# Patient Record
Sex: Male | Born: 1950 | Race: Black or African American | Hispanic: No | Marital: Married | Smoking: Former smoker
Health system: Southern US, Community
[De-identification: ages and names within clinical notes are randomized; demographics above are authoritative.]

## PROBLEM LIST (undated history)

## (undated) DIAGNOSIS — H409 Unspecified glaucoma: Secondary | ICD-10-CM

## (undated) DIAGNOSIS — A4902 Methicillin resistant Staphylococcus aureus infection, unspecified site: Secondary | ICD-10-CM

## (undated) DIAGNOSIS — E119 Type 2 diabetes mellitus without complications: Secondary | ICD-10-CM

## (undated) DIAGNOSIS — K769 Liver disease, unspecified: Secondary | ICD-10-CM

## (undated) DIAGNOSIS — E785 Hyperlipidemia, unspecified: Secondary | ICD-10-CM

## (undated) DIAGNOSIS — I10 Essential (primary) hypertension: Secondary | ICD-10-CM

## (undated) DIAGNOSIS — K219 Gastro-esophageal reflux disease without esophagitis: Secondary | ICD-10-CM

## (undated) DIAGNOSIS — Z794 Long term (current) use of insulin: Secondary | ICD-10-CM

## (undated) DIAGNOSIS — H25019 Cortical age-related cataract, unspecified eye: Secondary | ICD-10-CM

## (undated) DIAGNOSIS — N189 Chronic kidney disease, unspecified: Secondary | ICD-10-CM

## (undated) DIAGNOSIS — K759 Inflammatory liver disease, unspecified: Secondary | ICD-10-CM

## (undated) DIAGNOSIS — E7800 Pure hypercholesterolemia, unspecified: Secondary | ICD-10-CM

## (undated) HISTORY — PX: HAND EXPLORATION: SHX1725

## (undated) HISTORY — PX: WISDOM TOOTH EXTRACTION: SHX21

## (undated) HISTORY — PX: COLONOSCOPY: SHX5424

---

## 2012-05-06 ENCOUNTER — Ambulatory Visit: Payer: Self-pay | Admitting: Family Medicine

## 2012-05-24 ENCOUNTER — Ambulatory Visit: Payer: Self-pay | Admitting: Family Medicine

## 2012-06-23 ENCOUNTER — Ambulatory Visit: Payer: Self-pay | Admitting: Family Medicine

## 2012-07-24 ENCOUNTER — Ambulatory Visit: Payer: Self-pay | Admitting: Family Medicine

## 2017-05-09 ENCOUNTER — Other Ambulatory Visit: Payer: Self-pay | Admitting: Internal Medicine

## 2017-05-09 DIAGNOSIS — R1314 Dysphagia, pharyngoesophageal phase: Secondary | ICD-10-CM

## 2017-05-09 DIAGNOSIS — R1011 Right upper quadrant pain: Secondary | ICD-10-CM

## 2017-05-15 ENCOUNTER — Encounter
Admission: RE | Admit: 2017-05-15 | Discharge: 2017-05-15 | Disposition: A | Discharge status: Home / Self Care | Payer: Medicare Other | Source: Ambulatory Visit | Attending: Internal Medicine | Admitting: Internal Medicine

## 2017-05-15 ENCOUNTER — Ambulatory Visit
Admission: RE | Admit: 2017-05-15 | Discharge: 2017-05-15 | Disposition: A | Discharge status: Home / Self Care | Payer: Medicare Other | Source: Ambulatory Visit | Attending: Internal Medicine | Admitting: Internal Medicine

## 2017-05-15 DIAGNOSIS — R1011 Right upper quadrant pain: Secondary | ICD-10-CM | POA: Diagnosis present

## 2017-05-15 DIAGNOSIS — R112 Nausea with vomiting, unspecified: Secondary | ICD-10-CM | POA: Insufficient documentation

## 2017-05-15 DIAGNOSIS — K8020 Calculus of gallbladder without cholecystitis without obstruction: Secondary | ICD-10-CM | POA: Insufficient documentation

## 2017-05-15 MED ORDER — TECHNETIUM TC 99M MEBROFENIN IV KIT
5.0000 | PACK | Freq: Once | INTRAVENOUS | Status: AC | PRN
  Administered 2017-05-15: 5.23 via INTRAVENOUS

## 2017-05-17 ENCOUNTER — Ambulatory Visit
Admission: RE | Admit: 2017-05-17 | Discharge: 2017-05-17 | Disposition: A | Discharge status: Home / Self Care | Payer: Medicare Other | Source: Ambulatory Visit | Attending: Internal Medicine | Admitting: Internal Medicine

## 2017-05-17 DIAGNOSIS — R1314 Dysphagia, pharyngoesophageal phase: Secondary | ICD-10-CM | POA: Insufficient documentation

## 2017-06-26 ENCOUNTER — Other Ambulatory Visit: Payer: Self-pay | Admitting: Student

## 2017-06-26 DIAGNOSIS — R112 Nausea with vomiting, unspecified: Secondary | ICD-10-CM

## 2017-07-18 ENCOUNTER — Other Ambulatory Visit: Payer: Medicare Other

## 2017-07-20 ENCOUNTER — Encounter
Admission: RE | Admit: 2017-07-20 | Discharge: 2017-07-20 | Disposition: A | Discharge status: Home / Self Care | Payer: Medicare Other | Source: Ambulatory Visit | Attending: Student | Admitting: Student

## 2017-07-20 DIAGNOSIS — R112 Nausea with vomiting, unspecified: Secondary | ICD-10-CM | POA: Insufficient documentation

## 2017-07-20 MED ORDER — TECHNETIUM TC 99M SULFUR COLLOID
2.0000 | Freq: Once | INTRAVENOUS | Status: AC | PRN
  Administered 2017-07-20: 2.32 via INTRAVENOUS

## 2017-09-17 ENCOUNTER — Encounter: Payer: Self-pay | Admitting: *Deleted

## 2017-09-18 ENCOUNTER — Ambulatory Visit
Admission: RE | Admit: 2017-09-18 | Discharge: 2017-09-18 | Disposition: A | Discharge status: Home / Self Care | Payer: Medicare Other | Source: Ambulatory Visit | Attending: Internal Medicine | Admitting: Internal Medicine

## 2017-09-18 ENCOUNTER — Ambulatory Visit: Payer: Medicare Other | Admitting: Anesthesiology

## 2017-09-18 ENCOUNTER — Other Ambulatory Visit: Payer: Self-pay

## 2017-09-18 ENCOUNTER — Encounter: Payer: Self-pay | Admitting: *Deleted

## 2017-09-18 ENCOUNTER — Encounter
Admission: RE | Disposition: A | Discharge status: Home / Self Care | Payer: Self-pay | Source: Ambulatory Visit | Attending: Internal Medicine

## 2017-09-18 DIAGNOSIS — H42 Glaucoma in diseases classified elsewhere: Secondary | ICD-10-CM | POA: Insufficient documentation

## 2017-09-18 DIAGNOSIS — K2950 Unspecified chronic gastritis without bleeding: Secondary | ICD-10-CM | POA: Diagnosis not present

## 2017-09-18 DIAGNOSIS — Z87891 Personal history of nicotine dependence: Secondary | ICD-10-CM | POA: Insufficient documentation

## 2017-09-18 DIAGNOSIS — Z794 Long term (current) use of insulin: Secondary | ICD-10-CM | POA: Diagnosis not present

## 2017-09-18 DIAGNOSIS — E1122 Type 2 diabetes mellitus with diabetic chronic kidney disease: Secondary | ICD-10-CM | POA: Diagnosis not present

## 2017-09-18 DIAGNOSIS — B1920 Unspecified viral hepatitis C without hepatic coma: Secondary | ICD-10-CM | POA: Insufficient documentation

## 2017-09-18 DIAGNOSIS — K219 Gastro-esophageal reflux disease without esophagitis: Secondary | ICD-10-CM | POA: Insufficient documentation

## 2017-09-18 DIAGNOSIS — E7800 Pure hypercholesterolemia, unspecified: Secondary | ICD-10-CM | POA: Insufficient documentation

## 2017-09-18 DIAGNOSIS — I129 Hypertensive chronic kidney disease with stage 1 through stage 4 chronic kidney disease, or unspecified chronic kidney disease: Secondary | ICD-10-CM | POA: Diagnosis not present

## 2017-09-18 DIAGNOSIS — N189 Chronic kidney disease, unspecified: Secondary | ICD-10-CM | POA: Diagnosis not present

## 2017-09-18 DIAGNOSIS — Z8614 Personal history of Methicillin resistant Staphylococcus aureus infection: Secondary | ICD-10-CM | POA: Insufficient documentation

## 2017-09-18 DIAGNOSIS — E1139 Type 2 diabetes mellitus with other diabetic ophthalmic complication: Secondary | ICD-10-CM | POA: Diagnosis not present

## 2017-09-18 DIAGNOSIS — Z79899 Other long term (current) drug therapy: Secondary | ICD-10-CM | POA: Diagnosis not present

## 2017-09-18 DIAGNOSIS — R112 Nausea with vomiting, unspecified: Secondary | ICD-10-CM | POA: Diagnosis present

## 2017-09-18 HISTORY — DX: Unspecified glaucoma: H40.9

## 2017-09-18 HISTORY — DX: Hyperlipidemia, unspecified: E78.5

## 2017-09-18 HISTORY — DX: Cortical age-related cataract, unspecified eye: H25.019

## 2017-09-18 HISTORY — DX: Essential (primary) hypertension: I10

## 2017-09-18 HISTORY — DX: Liver disease, unspecified: K76.9

## 2017-09-18 HISTORY — DX: Methicillin resistant Staphylococcus aureus infection, unspecified site: A49.02

## 2017-09-18 HISTORY — DX: Inflammatory liver disease, unspecified: K75.9

## 2017-09-18 HISTORY — DX: Chronic kidney disease, unspecified: N18.9

## 2017-09-18 HISTORY — DX: Long term (current) use of insulin: Z79.4

## 2017-09-18 HISTORY — DX: Type 2 diabetes mellitus without complications: E11.9

## 2017-09-18 HISTORY — DX: Gastro-esophageal reflux disease without esophagitis: K21.9

## 2017-09-18 HISTORY — DX: Pure hypercholesterolemia, unspecified: E78.00

## 2017-09-18 HISTORY — PX: ESOPHAGOGASTRODUODENOSCOPY (EGD) WITH PROPOFOL: SHX5813

## 2017-09-18 LAB — GLUCOSE, CAPILLARY: GLUCOSE-CAPILLARY: 287 mg/dL — AB (ref 65–99)

## 2017-09-18 SURGERY — ESOPHAGOGASTRODUODENOSCOPY (EGD) WITH PROPOFOL
Anesthesia: General

## 2017-09-18 MED ORDER — LIDOCAINE HCL (PF) 1 % IJ SOLN
INTRAMUSCULAR | Status: AC
  Administered 2017-09-18: 0.3 mL
  Filled 2017-09-18: qty 2

## 2017-09-18 MED ORDER — LIDOCAINE HCL (PF) 2 % IJ SOLN
INTRAMUSCULAR | Status: AC
  Filled 2017-09-18: qty 10

## 2017-09-18 MED ORDER — LIDOCAINE HCL (CARDIAC) 20 MG/ML IV SOLN
INTRAVENOUS | Status: DC | PRN
  Administered 2017-09-18: 50 mg via INTRAVENOUS

## 2017-09-18 MED ORDER — PROPOFOL 10 MG/ML IV BOLUS
INTRAVENOUS | Status: DC | PRN
  Administered 2017-09-18: 50 mg via INTRAVENOUS
  Administered 2017-09-18: 30 mg via INTRAVENOUS

## 2017-09-18 MED ORDER — SODIUM CHLORIDE 0.9 % IV SOLN
INTRAVENOUS | Status: DC
  Administered 2017-09-18: 1000 mL via INTRAVENOUS

## 2017-09-18 MED ORDER — PROPOFOL 500 MG/50ML IV EMUL
INTRAVENOUS | Status: AC
  Filled 2017-09-18: qty 50

## 2017-09-18 MED ORDER — PROPOFOL 500 MG/50ML IV EMUL
INTRAVENOUS | Status: DC | PRN
  Administered 2017-09-18: 180 ug/kg/min via INTRAVENOUS

## 2017-09-18 NOTE — Anesthesia Postprocedure Evaluation (Signed)
Anesthesia Post Note  Patient: Steven Patel  Procedure(s) Performed: ESOPHAGOGASTRODUODENOSCOPY (EGD) WITH PROPOFOL (N/A )  Patient location during evaluation: Endoscopy Anesthesia Type: General Level of consciousness: awake and alert and oriented Pain management: pain level controlled Vital Signs Assessment: post-procedure vital signs reviewed and stable Respiratory status: spontaneous breathing, nonlabored ventilation and respiratory function stable Cardiovascular status: blood pressure returned to baseline and stable Postop Assessment: no signs of nausea or vomiting Anesthetic complications: no     Last Vitals:  Vitals:   09/18/17 1454 09/18/17 1504  BP: (!) 148/109 (!) 160/100  Pulse: 85 80  Resp: (!) 24 (!) 25  Temp:    SpO2: 100% 100%    Last Pain:  Vitals:   09/18/17 1434  TempSrc: Tympanic                 Steven Patel

## 2017-09-18 NOTE — Anesthesia Preprocedure Evaluation (Signed)
Anesthesia Evaluation  Patient identified by MRN, date of birth, ID band Patient awake    Reviewed: Allergy & Precautions, NPO status , Patient's Chart, lab work & pertinent test results  History of Anesthesia Complications Negative for: history of anesthetic complications  Airway Mallampati: III  TM Distance: >3 FB Neck ROM: Full    Dental  (+) Edentulous Upper, Edentulous Lower   Pulmonary neg sleep apnea, neg COPD, former smoker,    breath sounds clear to auscultation- rhonchi (-) wheezing      Cardiovascular hypertension, Pt. on medications (-) CAD, (-) Past MI, (-) Cardiac Stents and (-) CABG  Rhythm:Regular Rate:Normal - Systolic murmurs and - Diastolic murmurs    Neuro/Psych negative neurological ROS  negative psych ROS   GI/Hepatic GERD  ,(+) Hepatitis - (s/p treatment), C  Endo/Other  diabetes, Insulin Dependent  Renal/GU Renal InsufficiencyRenal disease     Musculoskeletal negative musculoskeletal ROS (+)   Abdominal (+) - obese,   Peds  Hematology negative hematology ROS (+)   Anesthesia Other Findings Past Medical History: No date: Cataract cortical, senile No date: CRI (chronic renal insufficiency) No date: Diabetes mellitus without complication (HCC) No date: GERD (gastroesophageal reflux disease) No date: Glaucoma No date: Hepatitis     Comment:  Hepatitis C No date: High cholesterol No date: Hyperlipidemia No date: Hypertension No date: Liver disease No date: Long-term insulin use (HCC) No date: MRSA infection     Comment:  right hand    Reproductive/Obstetrics                             Anesthesia Physical Anesthesia Plan  ASA: III  Anesthesia Plan: General   Post-op Pain Management:    Induction: Intravenous  PONV Risk Score and Plan: 1 and Propofol infusion  Airway Management Planned: Natural Airway  Additional Equipment:   Intra-op Plan:    Post-operative Plan:   Informed Consent: I have reviewed the patients History and Physical, chart, labs and discussed the procedure including the risks, benefits and alternatives for the proposed anesthesia with the patient or authorized representative who has indicated his/her understanding and acceptance.   Dental advisory given  Plan Discussed with: CRNA and Anesthesiologist  Anesthesia Plan Comments:         Anesthesia Quick Evaluation

## 2017-09-18 NOTE — Anesthesia Post-op Follow-up Note (Signed)
Anesthesia QCDR form completed.        

## 2017-09-18 NOTE — Transfer of Care (Signed)
Immediate Anesthesia Transfer of Care Note  Patient: Steven Patel  Procedure(s) Performed: ESOPHAGOGASTRODUODENOSCOPY (EGD) WITH PROPOFOL (N/A )  Patient Location: PACU  Anesthesia Type:General  Level of Consciousness: awake and alert   Airway & Oxygen Therapy: Patient Spontanous Breathing and Patient connected to nasal cannula oxygen  Post-op Assessment: Report given to RN and Post -op Vital signs reviewed and stable  Post vital signs: Reviewed and stable  Last Vitals:  Vitals:   09/18/17 1329 09/18/17 1434  BP: (!) 171/112 130/89  Pulse: 93 (!) 102  Resp: 20 15  Temp: (!) 36.3 C 36.9 C  SpO2: 100% 100%    Last Pain:  Vitals:   09/18/17 1434  TempSrc: Tympanic      Patients Stated Pain Goal: 8 (09/18/17 1329)  Complications: No apparent anesthesia complications

## 2017-09-18 NOTE — Anesthesia Procedure Notes (Signed)
Date/Time: 09/18/2017 2:15 PM Performed by: Ginger CarneMichelet, Matej Sappenfield, CRNA Pre-anesthesia Checklist: Patient identified, Emergency Drugs available, Patient being monitored, Suction available and Timeout performed Patient Re-evaluated:Patient Re-evaluated prior to induction Oxygen Delivery Method: Nasal cannula

## 2017-09-18 NOTE — Interval H&P Note (Signed)
History and Physical Interval Note:  09/18/2017 2:12 PM  Steven Patel  has presented today for surgery, with the diagnosis of NAS VOMITING  The various methods of treatment have been discussed with the patient and family. After consideration of risks, benefits and other options for treatment, the patient has consented to  Procedure(s): ESOPHAGOGASTRODUODENOSCOPY (EGD) WITH PROPOFOL (N/A) as a surgical intervention .  The patient's history has been reviewed, patient examined, no change in status, stable for surgery.  I have reviewed the patient's chart and labs.  Questions were answered to the patient's satisfaction.     [place], [place]

## 2017-09-18 NOTE — Op Note (Signed)
[facility] Patient Name: Stanlee Roehrig Procedure Date: 09/18/2017 2:07 PM MRN: [id number] Account #: [id number] Date of Birth: 1950/08/21 Admit Type: Outpatient Age: 67 Room: [facility] ENDO ROOM 2 Gender: Male Note Status: Finalized Procedure:            Upper GI endoscopy Indications:          Nausea with vomiting Providers:            Boykin Nearing. [name] MD, MD Referring MD:         Danella Penton, MD (Referring MD) Medicines:            Propofol per Anesthesia Complications:        No immediate complications. Procedure:            Pre-Anesthesia Assessment:                       - The risks and benefits of the procedure and the                        sedation options and risks were discussed with the                        patient. All questions were answered and informed                        consent was obtained.                       - Patient identification and proposed procedure were                        verified prior to the procedure by the nurse. The                        procedure was verified in the procedure room.                       - ASA Grade Assessment: III - A patient with severe                        systemic disease.                       - After reviewing the risks and benefits, the patient                        was deemed in satisfactory condition to undergo the                        procedure.                       After obtaining informed consent, the endoscope was                        passed under direct vision. Throughout the procedure,                        the patient's blood pressure, pulse, and oxygen  saturations were monitored continuously. The Endoscope                        was introduced through the mouth, and advanced to the                        third part of duodenum. The upper GI endoscopy was                        accomplished without difficulty. The patient tolerated                   the procedure well. Findings:      The examined esophagus was normal.      Localized moderately erythematous mucosa without bleeding was found in       the gastric antrum. Biopsies were taken with a cold forceps for       Helicobacter pylori testing. Biopsies were taken with a cold forceps for       Helicobacter pylori testing.      The examined duodenum was normal. Impression:           - Normal esophagus.                       - Erythematous mucosa in the antrum. Biopsied.                       - Normal examined duodenum. Recommendation:       - Patient has a contact number available for                        emergencies. The signs and symptoms of potential                        delayed complications were discussed with the patient.                        Return to normal activities tomorrow. Written discharge                        instructions were provided to the patient.                       - Resume previous diet.                       - Continue present medications.                       - Await pathology results.                       - Return to GI office in 6 weeks.                       - The findings and recommendations were discussed with                        the patient and their family. Procedure Code(s):    --- Professional ---                       [phone], Esophagogastroduodenoscopy, flexible,  transoral;                        with biopsy, single or multiple Diagnosis Code(s):    --- Professional ---                       R11.2, Nausea with vomiting, unspecified                       K31.89, Other diseases of stomach and duodenum CPT copyright 2016 American Medical Association. All rights reserved. The codes documented in this report are preliminary and upon coder review may  be revised to meet current compliance requirements. Stanton Kidneyeodoro K Jonathen Rathman MD, MD 09/18/2017 2:33:51 PM This report has been signed electronically. Number of Addenda: 0 Note  Initiated On: 09/18/2017 2:07 PM      [facility]

## 2017-09-18 NOTE — H&P (Signed)
Outpatient short stay form Pre-procedure 09/18/2017 9:29 AM Rafeef Lau K. Norma Fredricksonoledo, M.D.  Primary Physician: Bethann PunchesMark Miller, M.D.  Reason for visit:  Early satiety, nausea and vomiting.  History of present illness:  Patient with a history of uncontrolled type 2 diabetes mellitus, insulin requiring, reports intermittent episodes of severe nausea and vomiting with early satiety.   No current facility-administered medications for this encounter.   Current Outpatient Medications:  .  amLODipine (NORVASC) 5 MG tablet, Take 5 mg by mouth daily., Disp: , Rfl:  .  carvedilol (COREG) 6.25 MG tablet, Take 6.25 mg by mouth 2 (two) times daily with a meal., Disp: , Rfl:  .  dexlansoprazole (DEXILANT) 60 MG capsule, Take 60 mg by mouth daily., Disp: , Rfl:  .  Insulin Aspart (NOVOLOG FLEXPEN [place]), Inject 15 mLs into the skin daily before supper., Disp: , Rfl:  .  Insulin Detemir (LEVEMIR FLEXTOUCH) 100 UNIT/ML Pen, Inject 32 Units into the skin daily at 10 pm., Disp: , Rfl:  .  latanoprost (XALATAN) 0.005 % ophthalmic solution, Place 1 drop into both eyes at bedtime., Disp: , Rfl:  .  losartan (COZAAR) 50 MG tablet, Take 50 mg by mouth daily., Disp: , Rfl:  .  metoCLOPramide (REGLAN) 5 MG tablet, Take 5 mg by mouth 2 (two) times daily at 10 AM and 5 PM., Disp: , Rfl:  .  Multiple Vitamin (MULTIVITAMIN) tablet, Take 1 tablet by mouth daily., Disp: , Rfl:  .  rosuvastatin (CRESTOR) 10 MG tablet, Take 10 mg by mouth daily., Disp: , Rfl:  .  sildenafil (VIAGRA) 100 MG tablet, Take 100 mg by mouth daily as needed for erectile dysfunction., Disp: , Rfl:  .  SODIUM CHLORIDE PO, Take 1 g by mouth 2 (two) times daily., Disp: , Rfl:  .  sucralfate (CARAFATE) 1 g tablet, Take 1 g by mouth 4 (four) times daily., Disp: , Rfl:   No medications prior to admission.     No Known Allergies   Past Medical History:  Diagnosis Date  . Cataract cortical, senile   . CRI (chronic renal insufficiency)   . Diabetes mellitus  without complication (HCC)   . GERD (gastroesophageal reflux disease)   . Glaucoma   . Hepatitis    Hepatitis C  . High cholesterol   . Hyperlipidemia   . Hypertension   . Liver disease   . Long-term insulin use (HCC)   . MRSA infection    right hand     Review of systems:      Physical Exam  General appearance: alert, cooperative and appears stated age Resp: clear to auscultation bilaterally Cardio: regular rate and rhythm, S1, S2 normal, no murmur, click, rub or gallop GI: soft, non-tender; bowel sounds normal; no masses,  no organomegaly Extremities: extremities normal, atraumatic, no cyanosis or edema     Planned procedures: EGD.The patient understands the nature of the planned procedure, indications, risks, alternatives and potential complications including but not limited to bleeding, infection, perforation, damage to internal organs and possible oversedation/side effects from anesthesia. The patient agrees and gives consent to proceed.  Please refer to procedure notes for findings, recommendations and patient disposition/instructions.    Layden Caterino K. Norma Fredricksonoledo, M.D. Gastroenterology 09/18/2017  9:29 AM

## 2017-09-19 ENCOUNTER — Encounter: Payer: Self-pay | Admitting: Internal Medicine

## 2017-09-20 LAB — SURGICAL PATHOLOGY

## 2018-02-27 ENCOUNTER — Emergency Department: Payer: Medicare Other

## 2018-02-27 ENCOUNTER — Other Ambulatory Visit: Payer: Self-pay

## 2018-02-27 ENCOUNTER — Observation Stay
Admission: EM | Admit: 2018-02-27 | Discharge: 2018-02-28 | Disposition: A | Discharge status: Home / Self Care | Payer: Medicare Other | Attending: Internal Medicine | Admitting: Internal Medicine

## 2018-02-27 DIAGNOSIS — Z87891 Personal history of nicotine dependence: Secondary | ICD-10-CM | POA: Insufficient documentation

## 2018-02-27 DIAGNOSIS — R112 Nausea with vomiting, unspecified: Secondary | ICD-10-CM | POA: Diagnosis present

## 2018-02-27 DIAGNOSIS — I10 Essential (primary) hypertension: Secondary | ICD-10-CM | POA: Diagnosis present

## 2018-02-27 DIAGNOSIS — I129 Hypertensive chronic kidney disease with stage 1 through stage 4 chronic kidney disease, or unspecified chronic kidney disease: Principal | ICD-10-CM | POA: Insufficient documentation

## 2018-02-27 DIAGNOSIS — E119 Type 2 diabetes mellitus without complications: Secondary | ICD-10-CM

## 2018-02-27 DIAGNOSIS — N182 Chronic kidney disease, stage 2 (mild): Secondary | ICD-10-CM | POA: Insufficient documentation

## 2018-02-27 DIAGNOSIS — Z794 Long term (current) use of insulin: Secondary | ICD-10-CM | POA: Diagnosis not present

## 2018-02-27 DIAGNOSIS — R531 Weakness: Secondary | ICD-10-CM | POA: Diagnosis present

## 2018-02-27 DIAGNOSIS — Z79899 Other long term (current) drug therapy: Secondary | ICD-10-CM | POA: Insufficient documentation

## 2018-02-27 DIAGNOSIS — K219 Gastro-esophageal reflux disease without esophagitis: Secondary | ICD-10-CM | POA: Diagnosis present

## 2018-02-27 DIAGNOSIS — E7800 Pure hypercholesterolemia, unspecified: Secondary | ICD-10-CM | POA: Diagnosis not present

## 2018-02-27 DIAGNOSIS — E785 Hyperlipidemia, unspecified: Secondary | ICD-10-CM | POA: Diagnosis present

## 2018-02-27 DIAGNOSIS — B1920 Unspecified viral hepatitis C without hepatic coma: Secondary | ICD-10-CM | POA: Diagnosis not present

## 2018-02-27 DIAGNOSIS — R9431 Abnormal electrocardiogram [ECG] [EKG]: Secondary | ICD-10-CM | POA: Diagnosis present

## 2018-02-27 DIAGNOSIS — E1122 Type 2 diabetes mellitus with diabetic chronic kidney disease: Secondary | ICD-10-CM | POA: Diagnosis not present

## 2018-02-27 LAB — URINALYSIS, ROUTINE W REFLEX MICROSCOPIC
BILIRUBIN URINE: NEGATIVE
Bacteria, UA: NONE SEEN
GLUCOSE, UA: NEGATIVE mg/dL
HGB URINE DIPSTICK: NEGATIVE
Ketones, ur: NEGATIVE mg/dL
Leukocytes, UA: NEGATIVE
Nitrite: NEGATIVE
PH: 6 (ref 5.0–8.0)
Protein, ur: 30 mg/dL — AB
SPECIFIC GRAVITY, URINE: 1.016 (ref 1.005–1.030)
Squamous Epithelial / LPF: NONE SEEN (ref 0–5)

## 2018-02-27 LAB — CBC
HEMATOCRIT: 51 % (ref 40.0–52.0)
HEMOGLOBIN: 17.1 g/dL (ref 13.0–18.0)
MCH: 31.1 pg (ref 26.0–34.0)
MCHC: 33.5 g/dL (ref 32.0–36.0)
MCV: 92.7 fL (ref 80.0–100.0)
Platelets: 179 10*3/uL (ref 150–440)
RBC: 5.5 MIL/uL (ref 4.40–5.90)
RDW: 14.4 % (ref 11.5–14.5)
WBC: 6.2 10*3/uL (ref 3.8–10.6)

## 2018-02-27 LAB — COMPREHENSIVE METABOLIC PANEL
ALT: 38 U/L (ref 0–44)
AST: 63 U/L — ABNORMAL HIGH (ref 15–41)
Albumin: 4.1 g/dL (ref 3.5–5.0)
Alkaline Phosphatase: 77 U/L (ref 38–126)
Anion gap: 13 (ref 5–15)
BUN: 20 mg/dL (ref 8–23)
CO2: 23 mmol/L (ref 22–32)
Calcium: 9.3 mg/dL (ref 8.9–10.3)
Chloride: 99 mmol/L (ref 98–111)
Creatinine, Ser: 1.34 mg/dL — ABNORMAL HIGH (ref 0.61–1.24)
GFR calc Af Amer: >60 mL/min (ref >60)
GFR calc non Af Amer: 53 mL/min — ABNORMAL LOW (ref >60)
Glucose, Bld: 213 mg/dL — ABNORMAL HIGH (ref 70–99)
Potassium: 4 mmol/L (ref 3.5–5.1)
Sodium: 135 mmol/L (ref 135–145)
Total Bilirubin: 1.4 mg/dL — ABNORMAL HIGH (ref 0.3–1.2)
Total Protein: 8.4 g/dL — ABNORMAL HIGH (ref 6.5–8.1)

## 2018-02-27 LAB — TROPONIN I: Troponin I: <0.03 ng/mL (ref <0.03)

## 2018-02-27 MED ORDER — INSULIN ASPART 100 UNIT/ML ~~LOC~~ SOLN
0.0000 [IU] | Freq: Three times a day (TID) | SUBCUTANEOUS | Status: DC
  Administered 2018-02-28: 2 [IU] via SUBCUTANEOUS
  Filled 2018-02-27: qty 1

## 2018-02-27 MED ORDER — PANTOPRAZOLE SODIUM 40 MG PO TBEC
40.0000 mg | DELAYED_RELEASE_TABLET | Freq: Every day | ORAL | Status: DC
  Administered 2018-02-28: 40 mg via ORAL
  Filled 2018-02-27: qty 1

## 2018-02-27 MED ORDER — LABETALOL HCL 5 MG/ML IV SOLN
10.0000 mg | INTRAVENOUS | Status: DC | PRN
  Administered 2018-02-27: 10 mg via INTRAVENOUS

## 2018-02-27 MED ORDER — ONDANSETRON HCL 4 MG/2ML IJ SOLN
INTRAMUSCULAR | Status: AC
  Filled 2018-02-27: qty 2

## 2018-02-27 MED ORDER — SODIUM CHLORIDE 0.9 % IV BOLUS
1000.0000 mL | Freq: Once | INTRAVENOUS | Status: AC
  Administered 2018-02-27: 1000 mL via INTRAVENOUS

## 2018-02-27 MED ORDER — ROSUVASTATIN CALCIUM 10 MG PO TABS
10.0000 mg | ORAL_TABLET | Freq: Every day | ORAL | Status: DC
  Administered 2018-02-28: 10 mg via ORAL
  Filled 2018-02-27: qty 1

## 2018-02-27 MED ORDER — LABETALOL HCL 5 MG/ML IV SOLN
INTRAVENOUS | Status: AC
  Administered 2018-02-27: 10 mg via INTRAVENOUS
  Filled 2018-02-27: qty 4

## 2018-02-27 MED ORDER — ONDANSETRON HCL 4 MG/2ML IJ SOLN
4.0000 mg | Freq: Once | INTRAMUSCULAR | Status: AC
  Administered 2018-02-27: 4 mg via INTRAVENOUS

## 2018-02-27 MED ORDER — CARVEDILOL 6.25 MG PO TABS
6.2500 mg | ORAL_TABLET | Freq: Two times a day (BID) | ORAL | Status: DC
  Administered 2018-02-28: 6.25 mg via ORAL
  Filled 2018-02-27: qty 1

## 2018-02-27 MED ORDER — ACETAMINOPHEN 650 MG RE SUPP: 650.0000 mg | Freq: Four times a day (QID) | RECTAL | Status: DC | PRN

## 2018-02-27 MED ORDER — CARVEDILOL 6.25 MG PO TABS
6.2500 mg | ORAL_TABLET | Freq: Once | ORAL | Status: AC
  Administered 2018-02-27: 6.25 mg via ORAL
  Filled 2018-02-27: qty 1

## 2018-02-27 MED ORDER — ACETAMINOPHEN 325 MG PO TABS: 650.0000 mg | ORAL_TABLET | Freq: Four times a day (QID) | ORAL | Status: DC | PRN

## 2018-02-27 MED ORDER — ENOXAPARIN SODIUM 40 MG/0.4ML ~~LOC~~ SOLN: 40.0000 mg | SUBCUTANEOUS | Status: DC

## 2018-02-27 MED ORDER — ONDANSETRON HCL 4 MG/2ML IJ SOLN: 4.0000 mg | Freq: Four times a day (QID) | INTRAMUSCULAR | Status: DC | PRN

## 2018-02-27 MED ORDER — INSULIN ASPART 100 UNIT/ML ~~LOC~~ SOLN: 0.0000 [IU] | Freq: Every day | SUBCUTANEOUS | Status: DC

## 2018-02-27 MED ORDER — AMLODIPINE BESYLATE 5 MG PO TABS
5.0000 mg | ORAL_TABLET | Freq: Every day | ORAL | Status: DC
  Administered 2018-02-28: 5 mg via ORAL
  Filled 2018-02-27: qty 1

## 2018-02-27 MED ORDER — LOSARTAN POTASSIUM 50 MG PO TABS
50.0000 mg | ORAL_TABLET | Freq: Every day | ORAL | Status: DC
  Administered 2018-02-28: 50 mg via ORAL
  Filled 2018-02-27: qty 1

## 2018-02-27 MED ORDER — ONDANSETRON HCL 4 MG PO TABS: 4.0000 mg | ORAL_TABLET | Freq: Four times a day (QID) | ORAL | Status: DC | PRN

## 2018-02-27 MED ORDER — METOCLOPRAMIDE HCL 10 MG PO TABS: 5.0000 mg | ORAL_TABLET | Freq: Two times a day (BID) | ORAL | Status: DC

## 2018-02-27 MED ORDER — ACETAMINOPHEN 500 MG PO TABS
1000.0000 mg | ORAL_TABLET | Freq: Once | ORAL | Status: AC
  Administered 2018-02-27: 1000 mg via ORAL
  Filled 2018-02-27: qty 2

## 2018-02-27 MED ORDER — LATANOPROST 0.005 % OP SOLN
1.0000 [drp] | Freq: Every day | OPHTHALMIC | Status: DC
  Filled 2018-02-27: qty 2.5

## 2018-02-27 NOTE — ED Triage Notes (Signed)
Pt c/o generalized weakness and just not feeling well today , states they checked his B/p at home it was 140/90, HR up to 130's per the b/p machine they brought with them .,. Denies SOB or chest pain.

## 2018-02-27 NOTE — ED Notes (Signed)
.   Pt is resting, Respirations even and unlabored, NAD. Stretcher lowest postion and locked. Call bell within reach. Denies any needs at this time RN will continue to monitor. BP trending up at this time EDP aware.

## 2018-02-27 NOTE — H&P (Signed)
[facility] [place] [facility]   PATIENT NAME: Steven Patel    MR#:  [id number]  DATE OF BIRTH:  1950/12/10  DATE OF ADMISSION:  02/27/2018  PRIMARY CARE PHYSICIAN: Danella Penton, MD   REQUESTING/REFERRING PHYSICIAN: Lenard Lance, MD  CHIEF COMPLAINT:   Chief Complaint  Patient presents with  . Tachycardia  . Weakness    HISTORY OF PRESENT ILLNESS:  Steven Patel  is a 67 y.o. male who presents with chief complaint as above.  Patient states that he felt weak at home all day.  He came to ED for evaluation and his work-up here was largely within normal limits except that his EKG had some abnormalities with some lateral T wave inversions and some Q waves anteriorly in V leads.  There was no prior EKG imaging to compare this to, and he did have an episode of nausea, vomiting and diaphoresis here in the ED.  Given that, hospitalist were called for admission and further evaluation.  On this writer's chart review there was no prior EKG image to compare to, but there was an EKG machine read out from 2006 which commented on lateral T wave abnormality and age-indeterminate possible anterior infarct.  Patient's blood pressure is also been significantly elevated  PAST MEDICAL HISTORY:   Past Medical History:  Diagnosis Date  . Cataract cortical, senile   . CRI (chronic renal insufficiency)   . Diabetes mellitus without complication (HCC)   . GERD (gastroesophageal reflux disease)   . Glaucoma   . Hepatitis    Hepatitis C  . High cholesterol   . Hyperlipidemia   . Hypertension   . Liver disease   . Long-term insulin use (HCC)   . MRSA infection    right hand      PAST SURGICAL HISTORY:   Past Surgical History:  Procedure Laterality Date  . COLONOSCOPY    . ESOPHAGOGASTRODUODENOSCOPY (EGD) WITH PROPOFOL N/A 09/18/2017   Procedure: ESOPHAGOGASTRODUODENOSCOPY (EGD) WITH PROPOFOL;  Surgeon: [place], Boykin Nearing, MD;  Location: [facility] ENDOSCOPY;  Service:  Gastroenterology;  Laterality: N/A;  . HAND EXPLORATION    . WISDOM TOOTH EXTRACTION       SOCIAL HISTORY:   Social History   Tobacco Use  . Smoking status: Former Smoker    Packs/day: 2.00    Years: 3.00    Pack years: 6.00    Types: Cigarettes  . Smokeless tobacco: Never Used  . Tobacco comment: last attempted to quit 08/18/1995  Substance Use Topics  . Alcohol use: Yes    Alcohol/week: 3.0 oz    Types: 1 Glasses of wine, 2 Cans of beer, 1 Shots of liquor, 1 Standard drinks or equivalent per week    Comment: per week      FAMILY HISTORY:   Family History  Problem Relation Age of Onset  . Cataracts Mother   . Glaucoma Mother   . Diabetes Brother      DRUG ALLERGIES:  No Known Allergies  MEDICATIONS AT HOME:   Prior to Admission medications   Medication Sig Start Date End Date Taking? Authorizing Provider  amLODipine (NORVASC) 5 MG tablet Take 5 mg by mouth daily.    [provider]  carvedilol (COREG) 6.25 MG tablet Take 6.25 mg by mouth 2 (two) times daily with a meal.    [provider]  dexlansoprazole (DEXILANT) 60 MG capsule Take 60 mg by mouth daily.    [provider]  Insulin Aspart (NOVOLOG FLEXPEN )  Inject 15 mLs into the skin daily before supper.    [provider]  Insulin Detemir (LEVEMIR FLEXTOUCH) 100 UNIT/ML Pen Inject 32 Units into the skin daily at 10 pm.    [provider]  latanoprost (XALATAN) 0.005 % ophthalmic solution Place 1 drop into both eyes at bedtime.    [provider]  losartan (COZAAR) 50 MG tablet Take 50 mg by mouth daily.    [provider]  metoCLOPramide (REGLAN) 5 MG tablet Take 5 mg by mouth 2 (two) times daily at 10 AM and 5 PM.    [provider]  Multiple Vitamin (MULTIVITAMIN) tablet Take 1 tablet by mouth daily.    [provider]  rosuvastatin (CRESTOR) 10 MG tablet Take 10 mg by mouth daily.    [provider]  sildenafil  (VIAGRA) 100 MG tablet Take 100 mg by mouth daily as needed for erectile dysfunction.    [provider]  SODIUM CHLORIDE PO Take 1 g by mouth 2 (two) times daily.    [provider]  sucralfate (CARAFATE) 1 g tablet Take 1 g by mouth 4 (four) times daily.    [provider]    REVIEW OF SYSTEMS:  Review of Systems  Constitutional: Positive for malaise/fatigue. Negative for chills, fever and weight loss.  HENT: Negative for ear pain, hearing loss and tinnitus.   Eyes: Negative for blurred vision, double vision, pain and redness.  Respiratory: Negative for cough, hemoptysis and shortness of breath.   Cardiovascular: Negative for chest pain, palpitations, orthopnea and leg swelling.  Gastrointestinal: Positive for nausea and vomiting. Negative for abdominal pain, constipation and diarrhea.  Genitourinary: Negative for dysuria, frequency and hematuria.  Musculoskeletal: Negative for back pain, joint pain and neck pain.  Skin:       No acne, rash, or lesions  Neurological: Negative for dizziness, tremors, focal weakness and weakness.  Endo/Heme/Allergies: Negative for polydipsia. Does not bruise/bleed easily.  Psychiatric/Behavioral: Negative for depression. The patient is not nervous/anxious and does not have insomnia.      VITAL SIGNS:   Vitals:   02/27/18 1940 02/27/18 1953 02/27/18 2000 02/27/18 2103  BP: (!) 159/108 (!) 144/102 (!) 150/111 (!) 159/106  Pulse: (!) 101 99 (!) 101 99  Resp:  18  18  Temp:  99.2 F (37.3 C)    TempSrc:  Oral    SpO2: 99%  99% 100%  Weight:      Height:       Wt Readings from Last 3 Encounters:  02/27/18 79.4 kg (175 lb)  09/18/17 77.1 kg (170 lb)  05/15/17 77.1 kg (170 lb)    PHYSICAL EXAMINATION:  Physical Exam  Vitals reviewed. Constitutional: He is oriented to person, place, and time. He appears well-developed and well-nourished. No distress.  HENT:  Head: Normocephalic and atraumatic.  Mouth/Throat:  Oropharynx is clear and moist.  Eyes: Pupils are equal, round, and reactive to light. Conjunctivae and EOM are normal. No scleral icterus.  Neck: Normal range of motion. Neck supple. No JVD present. No thyromegaly present.  Cardiovascular: Regular rhythm and intact distal pulses. Exam reveals no gallop and no friction rub.  No murmur heard. Tachycardic  Respiratory: Effort normal and breath sounds normal. No respiratory distress. He has no wheezes. He has no rales.  GI: Soft. Bowel sounds are normal. He exhibits no distension. There is no tenderness.  Musculoskeletal: Normal range of motion. He exhibits no edema.  No arthritis, no gout  Lymphadenopathy:  He has no cervical adenopathy.  Neurological: He is alert and oriented to person, place, and time. No cranial nerve deficit.  No dysarthria, no aphasia  Skin: Skin is warm and dry. No rash noted. No erythema.  Psychiatric: He has a normal mood and affect. His behavior is normal. Judgment and thought content normal.    LABORATORY PANEL:   CBC Recent Labs  Lab 02/27/18 1612  WBC 6.2  HGB 17.1  HCT 51.0  PLT 179   ------------------------------------------------------------------------------------------------------------------  Chemistries  Recent Labs  Lab 02/27/18 1623  NA 135  K 4.0  CL 99  CO2 23  GLUCOSE 213*  BUN 20  CREATININE 1.34*  CALCIUM 9.3  AST 63*  ALT 38  ALKPHOS 77  BILITOT 1.4*   ------------------------------------------------------------------------------------------------------------------  Cardiac Enzymes Recent Labs  Lab 02/27/18 1612  TROPONINI <0.03   ------------------------------------------------------------------------------------------------------------------  RADIOLOGY:  Dg Chest 2 View  Result Date: 02/27/2018 CLINICAL DATA:  67 year old male with a history of weakness EXAM: CHEST - 2 VIEW COMPARISON:  None. FINDINGS: Cardiomediastinal silhouette within normal limits. No  evidence of central vascular congestion. No pneumothorax or pleural effusion. No confluent airspace disease. No acute displaced fracture. IMPRESSION: Negative for acute cardiopulmonary disease Electronically Signed   By: Gilmer MorJaime  Wagner D.O.   On: 02/27/2018 16:59    EKG:   Orders placed or performed during the hospital encounter of 02/27/18  . ED EKG within 10 minutes  . ED EKG within 10 minutes  . EKG 12-Lead  . EKG 12-Lead    IMPRESSION AND PLAN:  Principal Problem:   Abnormal EKG -unclear as to whether or not these changes are new, see HPI for details.  We will trend his cardiac enzymes, get an echocardiogram and a cardiology consult Active Problems:   Weakness -likely related to whatever is going on primarily, whether cardiac in nature or related to his elevated blood pressure, see above and below for treatment   Uncontrolled HTN (hypertension) -continue home meds, additional PRN medications for blood pressure goal less than 160/100   Diabetes (HCC) -on a scale insulin with corresponding glucose checks   HLD (hyperlipidemia) -dose antilipid   GERD (gastroesophageal reflux disease) -Home dose PPI   Chart review performed and case discussed with ED provider. Labs, imaging and/or ECG reviewed by provider and discussed with patient/family. Management plans discussed with the patient and/or family.  DVT PROPHYLAXIS: SubQ lovenox   GI PROPHYLAXIS:  PPI   ADMISSION STATUS: Observation  CODE STATUS: Full Advance Directive Documentation     Most Recent Value  Type of Advance Directive  Healthcare Power of Attorney  Pre-existing out of facility DNR order (yellow form or pink MOST form)  -  "MOST" Form in Place?  -      TOTAL TIME TAKING CARE OF THIS PATIENT: 40 minutes.   Lexii Walsh FIELDING 02/27/2018, 9:07 PM  [facility]  Office  [phone]  CC: Primary care physician; Danella PentonMiller, Mark F, MD  Note:  This document was prepared using Dragon voice  recognition software and may include unintentional dictation errors.

## 2018-02-27 NOTE — ED Provider Notes (Signed)
[facility] Emergency Department Provider Note  Time seen: 5:14 PM  I have reviewed the triage vital signs and the nursing notes.   HISTORY  Chief Complaint Tachycardia and Weakness    HPI JOHNATAN BASKETTE is a 67 y.o. male with a past medical history of renal insufficiency diabetes, gastric reflux, hypertension, hyperlipidemia history of hepatitis C status post treatment, presents to the emergency department for generalized fatigue and weakness today.  According to the patient since this morning he is felt very weak, denies any other symptoms.  Denies any fever, cough or congestion.  Denies any nausea, vomiting or diarrhea.  Denies abdominal pain.  Largely negative review of systems.   Past Medical History:  Diagnosis Date  . Cataract cortical, senile   . CRI (chronic renal insufficiency)   . Diabetes mellitus without complication (HCC)   . GERD (gastroesophageal reflux disease)   . Glaucoma   . Hepatitis    Hepatitis C  . High cholesterol   . Hyperlipidemia   . Hypertension   . Liver disease   . Long-term insulin use (HCC)   . MRSA infection    right hand     There are no active problems to display for this patient.   Past Surgical History:  Procedure Laterality Date  . COLONOSCOPY    . ESOPHAGOGASTRODUODENOSCOPY (EGD) WITH PROPOFOL N/A 09/18/2017   Procedure: ESOPHAGOGASTRODUODENOSCOPY (EGD) WITH PROPOFOL;  Surgeon: Toledo, Boykin Nearing, MD;  Location: [facility] ENDOSCOPY;  Service: Gastroenterology;  Laterality: N/A;  . HAND EXPLORATION    . WISDOM TOOTH EXTRACTION      Prior to Admission medications   Medication Sig Start Date End Date Taking? Authorizing Provider  amLODipine (NORVASC) 5 MG tablet Take 5 mg by mouth daily.    [provider]  carvedilol (COREG) 6.25 MG tablet Take 6.25 mg by mouth 2 (two) times daily with a meal.    [provider]  dexlansoprazole (DEXILANT) 60 MG capsule Take 60 mg by mouth daily.    [provider]  Insulin Aspart (NOVOLOG FLEXPEN [place]) Inject 15 mLs into the skin daily before supper.    [provider]  Insulin Detemir (LEVEMIR FLEXTOUCH) 100 UNIT/ML Pen Inject 32 Units into the skin daily at 10 pm.    [provider]  latanoprost (XALATAN) 0.005 % ophthalmic solution Place 1 drop into both eyes at bedtime.    [provider]  losartan (COZAAR) 50 MG tablet Take 50 mg by mouth daily.    [provider]  metoCLOPramide (REGLAN) 5 MG tablet Take 5 mg by mouth 2 (two) times daily at 10 AM and 5 PM.    [provider]  Multiple Vitamin (MULTIVITAMIN) tablet Take 1 tablet by mouth daily.    [provider]  rosuvastatin (CRESTOR) 10 MG tablet Take 10 mg by mouth daily.    [provider]  sildenafil (VIAGRA) 100 MG tablet Take 100 mg by mouth daily as needed for erectile dysfunction.    [provider]  SODIUM CHLORIDE PO Take 1 g by mouth 2 (two) times daily.    [provider]  sucralfate (CARAFATE) 1 g tablet Take 1 g by mouth 4 (four) times daily.    [provider]    No Known Allergies  Family History  Problem Relation Age of Onset  . Cataracts Mother   . Glaucoma Mother   . Diabetes Brother     Social History Social History   Tobacco  Use  . Smoking status: Former Smoker    Packs/day: 2.00    Years: 3.00    Pack years: 6.00    Types: Cigarettes  . Smokeless tobacco: Never Used  . Tobacco comment: last attempted to quit 08/18/1995  Substance Use Topics  . Alcohol use: Yes    Alcohol/week: 3.0 oz    Types: 1 Glasses of wine, 2 Cans of beer, 1 Shots of liquor, 1 Standard drinks or equivalent per week    Comment: per week   . Drug use: No    Review of Systems Constitutional: Negative for fever.  Positive for generalized weakness. Eyes: Negative for visual complaints ENT: Negative for recent illness/congestion Cardiovascular: Negative for chest pain. Respiratory:  Negative for shortness of breath. Gastrointestinal: Negative for abdominal pain, vomiting and diarrhea. Genitourinary: Negative for urinary compaints Musculoskeletal: Negative for musculoskeletal complaints Skin: Negative for skin complaints  Neurological: Negative for headache All other ROS negative  ____________________________________________   PHYSICAL EXAM:  VITAL SIGNS: ED Triage Vitals  Enc Vitals Group     BP 02/27/18 1613 (!) 158/99     Pulse Rate 02/27/18 1613 (!) 126     Resp 02/27/18 1613 18     Temp 02/27/18 1613 99 F (37.2 C)     Temp Source 02/27/18 1613 Oral     SpO2 02/27/18 1613 97 %     Weight 02/27/18 1611 175 lb (79.4 kg)     Height 02/27/18 1611 6' (1.829 m)     Head Circumference --      Peak Flow --      Pain Score 02/27/18 1611 0     Pain Loc --      Pain Edu? --      Excl. in GC? --    Constitutional: Alert and oriented. Well appearing and in no distress. Eyes: Normal exam ENT   Head: Normocephalic and atraumatic.   Mouth/Throat: Mucous membranes are moist. Cardiovascular: Normal rate, regular rhythm. No murmur Respiratory: Normal respiratory effort without tachypnea nor retractions. Breath sounds are clear  Gastrointestinal: Soft and nontender. No distention.   Musculoskeletal: Nontender with normal range of motion in all extremities. No lower extremity tenderness or edema. Neurologic:  Normal speech and language. No gross focal neurologic deficits  Skin:  Skin is warm, dry and intact.  Psychiatric: Mood and affect are normal.  ____________________________________________    EKG  EKG reviewed and interpreted by myself shows sinus tachycardia 123 bpm with a narrow QRS, normal axis, largely normal intervals.  Patient does have T wave inversions in the lateral leads.  No old EKG in our system for comparison.  I discussed this with the patient he has been told that his EKG is abnormal in the past but does not know what the abnormality  is.  ____________________________________________    RADIOLOGY  Chest x-ray negative  ____________________________________________   INITIAL IMPRESSION / ASSESSMENT AND PLAN / ED COURSE  Pertinent labs & imaging results that were available during my care of the patient were reviewed by me and considered in my medical decision making (see chart for details).  Patient presents to the emergency department for generalized weakness.  Differential is quite broad but would include infectious etiology, metabolic or electrolyte abnormality, ACS, dehydration.  We will check labs, IV hydrate and continue to closely monitor.  Patient's EKG is abnormal with lateral T wave inversions but no old EKG for comparison we will check a troponin.  Overall the patient appears well with an  overall normal physical examination besides mild tachycardia around 110 bpm.  Patient's labs are resulted largely within normal limits including a negative troponin.  Chest x-ray is normal.  EKG shows lateral T wave inversions with no old EKG for comparison.  Patient states he was feeling better however then he became very weak once again and became diaphoretic and nauseated.  Patient vomited became tachycardic once again.  Patient's temperature remains around 99.  Possible viral syndrome however given the patient's nausea diaphoresis with T wave inversions no old EKG for comparison I believe the patient would benefit from admission to the hospital for continued monitoring and treatment.  Patient agreeable to this plan of care. ____________________________________________   FINAL CLINICAL IMPRESSION(S) / ED DIAGNOSES  Generalized weakness Nausea vomiting Abnormal EKG   Minna Antis, MD 02/27/18 2054

## 2018-02-27 NOTE — ED Notes (Signed)
Nausea and vomiting better

## 2018-02-27 NOTE — ED Notes (Signed)
Patient stood up and  Voided 100 ml

## 2018-02-27 NOTE — ED Notes (Signed)
.   Pt is resting, Respirations even and unlabored, NAD. Stretcher lowest postion and locked. Call bell within reach. Denies any needs at this time RN will continue to monitor.    

## 2018-02-28 ENCOUNTER — Other Ambulatory Visit: Payer: Self-pay

## 2018-02-28 ENCOUNTER — Observation Stay: Payer: Medicare Other

## 2018-02-28 ENCOUNTER — Observation Stay
Admit: 2018-02-28 | Discharge: 2018-02-28 | Disposition: A | Discharge status: Home / Self Care | Payer: Medicare Other | Attending: Internal Medicine | Admitting: Internal Medicine

## 2018-02-28 DIAGNOSIS — I129 Hypertensive chronic kidney disease with stage 1 through stage 4 chronic kidney disease, or unspecified chronic kidney disease: Secondary | ICD-10-CM | POA: Diagnosis not present

## 2018-02-28 LAB — CBC
HCT: 45.6 % (ref 40.0–52.0)
Hemoglobin: 15.5 g/dL (ref 13.0–18.0)
MCH: 31.4 pg (ref 26.0–34.0)
MCHC: 34 g/dL (ref 32.0–36.0)
MCV: 92.3 fL (ref 80.0–100.0)
PLATELETS: 141 10*3/uL — AB (ref 150–440)
RBC: 4.94 MIL/uL (ref 4.40–5.90)
RDW: 14.4 % (ref 11.5–14.5)
WBC: 6.9 10*3/uL (ref 3.8–10.6)

## 2018-02-28 LAB — ECHOCARDIOGRAM COMPLETE
HEIGHTINCHES: 72 in
Weight: 2758.4 oz

## 2018-02-28 LAB — BASIC METABOLIC PANEL
Anion gap: 9 (ref 5–15)
BUN: 20 mg/dL (ref 8–23)
CO2: 24 mmol/L (ref 22–32)
CREATININE: 1.15 mg/dL (ref 0.61–1.24)
Calcium: 8.7 mg/dL — ABNORMAL LOW (ref 8.9–10.3)
Chloride: 103 mmol/L (ref 98–111)
Glucose, Bld: 177 mg/dL — ABNORMAL HIGH (ref 70–99)
POTASSIUM: 4.1 mmol/L (ref 3.5–5.1)
SODIUM: 136 mmol/L (ref 135–145)

## 2018-02-28 LAB — TROPONIN I: Troponin I: <0.03 ng/mL (ref <0.03)

## 2018-02-28 MED ORDER — AMLODIPINE BESYLATE 10 MG PO TABS: 10.0000 mg | ORAL_TABLET | Freq: Every day | ORAL | 0 refills | Status: DC

## 2018-02-28 MED ORDER — TECHNETIUM TC 99M TETROFOSMIN IV KIT
13.2400 | PACK | Freq: Once | INTRAVENOUS | Status: AC | PRN
  Administered 2018-02-28: 13.24 via INTRAVENOUS

## 2018-02-28 MED ORDER — REGADENOSON 0.4 MG/5ML IV SOLN
0.4000 mg | Freq: Once | INTRAVENOUS | Status: AC
  Administered 2018-02-28: 0.4 mg via INTRAVENOUS

## 2018-02-28 MED ORDER — CARVEDILOL 6.25 MG PO TABS: 6.2500 mg | ORAL_TABLET | Freq: Two times a day (BID) | ORAL | 0 refills | Status: AC

## 2018-02-28 MED ORDER — TECHNETIUM TC 99M TETROFOSMIN IV KIT
30.6300 | PACK | Freq: Once | INTRAVENOUS | Status: AC | PRN
  Administered 2018-02-28: 30.63 via INTRAVENOUS

## 2018-02-28 NOTE — Discharge Summary (Signed)
[facility] - [place] [facility]   PATIENT NAME: Steven Patel    MR#:  [id number]  DATE OF BIRTH:  24-Jun-1951  DATE OF ADMISSION:  02/27/2018 ADMITTING PHYSICIAN: Oralia Manisavid Willis, MD  DATE OF DISCHARGE: 02/28/2018  PRIMARY CARE PHYSICIAN: Danella PentonMiller, Mark F, MD    ADMISSION DIAGNOSIS:  Weakness generalized [R53.1] Abnormal EKG [R94.31] Non-intractable vomiting with nausea, unspecified vomiting type [R11.2]  DISCHARGE DIAGNOSIS:  Principal Problem:   Abnormal EKG Active Problems:   Weakness   HTN (hypertension)   HLD (hyperlipidemia)   GERD (gastroesophageal reflux disease)   Diabetes (HCC)   SECONDARY DIAGNOSIS:   Past Medical History:  Diagnosis Date  . Cataract cortical, senile   . CRI (chronic renal insufficiency)   . Diabetes mellitus without complication (HCC)   . GERD (gastroesophageal reflux disease)   . Glaucoma   . Hepatitis    Hepatitis C  . High cholesterol   . Hyperlipidemia   . Hypertension   . Liver disease   . Long-term insulin use (HCC)   . MRSA infection    right hand     HOSPITAL COURSE:   1.  Hypertension and tachycardia.  Abnormal EKG.  On further questioning the patient only taking his Coreg once a day and not twice a day.  I advised him that this will help his heart rate stay in a better range of he takes it twice a day.  Stress test reported as negative.  Patient declined CT Angio of the chest to rule out pulmonary embolism stating he feels okay.  Echocardiogram also still pending.  Titrate up Norvasc to 10 mg daily and advised to take Coreg twice a day. 2.  Type 2 diabetes mellitus continue usual medications 3.  Chronic kidney disease stage II 4.  Weakness patient feeling better today 5.  Hyperlipidemia unspecified on Crestor 6.  GERD on PPI   DISCHARGE CONDITIONS:   Satisfactory  CONSULTS OBTAINED:  None  DRUG ALLERGIES:  No Known Allergies  DISCHARGE MEDICATIONS:   Allergies as of 02/28/2018   No Known  Allergies     Medication List    TAKE these medications   amLODipine 10 MG tablet Commonly known as:  NORVASC Take 1 tablet (10 mg total) by mouth daily. What changed:    medication strength  how much to take   carvedilol 6.25 MG tablet Commonly known as:  COREG Take 1 tablet (6.25 mg total) by mouth 2 (two) times daily with a meal.   esomeprazole 40 MG capsule Commonly known as:  NEXIUM Take 40 mg by mouth every evening.   latanoprost 0.005 % ophthalmic solution Commonly known as:  XALATAN Place 1 drop into both eyes at bedtime.   LEVEMIR FLEXTOUCH 100 UNIT/ML Pen Generic drug:  Insulin Detemir Inject 50 Units into the skin at bedtime.   losartan 50 MG tablet Commonly known as:  COZAAR Take 50 mg by mouth daily.   montelukast 10 MG tablet Commonly known as:  SINGULAIR Take 10 mg by mouth daily.   multivitamin tablet Take 1 tablet by mouth daily.   NOVOLOG FLEXPEN [place] Inject 5u under the skin at breakfast, 10u at lunch and 5u at dinnertime   rosuvastatin 10 MG tablet Commonly known as:  CRESTOR Take 10 mg by mouth every evening.   sildenafil 100 MG tablet Commonly known as:  VIAGRA Take 100 mg by mouth daily as needed for erectile dysfunction.   sucralfate 1 g tablet Commonly known as:  CARAFATE  Take 1 g by mouth 4 (four) times daily -  with meals and at bedtime.        DISCHARGE INSTRUCTIONS:   Follow-up PMD 5 days Follow-up cardiology as outpatient  If you experience worsening of your admission symptoms, develop shortness of breath, life threatening emergency, suicidal or homicidal thoughts you must seek medical attention immediately by calling 911 or calling your MD immediately  if symptoms less severe.  You Must read complete instructions/literature along with all the possible adverse reactions/side effects for all the Medicines you take and that have been prescribed to you. Take any new Medicines after you have completely understood and accept  all the possible adverse reactions/side effects.   Please note  You were cared for by a hospitalist during your hospital stay. If you have any questions about your discharge medications or the care you received while you were in the hospital after you are discharged, you can call the unit and asked to speak with the hospitalist on call if the hospitalist that took care of you is not available. Once you are discharged, your primary care physician will handle any further medical issues. Please note that NO REFILLS for any discharge medications will be authorized once you are discharged, as it is imperative that you return to your primary care physician (or establish a relationship with a primary care physician if you do not have one) for your aftercare needs so that they can reassess your need for medications and monitor your lab values.    Today   CHIEF COMPLAINT:   Chief Complaint  Patient presents with  . Tachycardia  . Weakness    HISTORY OF PRESENT ILLNESS:  Steven Patel  is a 67 y.o. male presented with high blood pressure and fast heart rate   VITAL SIGNS:  Blood pressure (!) 143/88, pulse 94, temperature 98.4 F (36.9 C), resp. rate 18, height 6' (1.829 m), weight 78.2 kg, SpO2 100 %.   PHYSICAL EXAMINATION:  GENERAL:  67 y.o.-year-old patient lying in the bed with no acute distress.  EYES: Pupils equal, round, reactive to light and accommodation. No scleral icterus. Extraocular muscles intact.  HEENT: Head atraumatic, normocephalic. Oropharynx and nasopharynx clear.  NECK:  Supple, no jugular venous distention. No thyroid enlargement, no tenderness.  LUNGS: Normal breath sounds bilaterally, no wheezing, rales,rhonchi or crepitation. No use of accessory muscles of respiration.  CARDIOVASCULAR: S1, S2 normal. No murmurs, rubs, or gallops.  ABDOMEN: Soft, non-tender, non-distended. Bowel sounds present. No organomegaly or mass.  EXTREMITIES: No pedal edema, cyanosis, or  clubbing.  NEUROLOGIC: Cranial nerves II through XII are intact. Muscle strength 5/5 in all extremities. Sensation intact. Gait not checked.  PSYCHIATRIC: The patient is alert and oriented x 3.  SKIN: No obvious rash, lesion, or ulcer.   DATA REVIEW:   CBC Recent Labs  Lab 02/28/18 0529  WBC 6.9  HGB 15.5  HCT 45.6  PLT 141*    Chemistries  Recent Labs  Lab 02/27/18 1623 02/28/18 0529  NA 135 136  K 4.0 4.1  CL 99 103  CO2 23 24  GLUCOSE 213* 177*  BUN 20 20  CREATININE 1.34* 1.15  CALCIUM 9.3 8.7*  AST 63*  --   ALT 38  --   ALKPHOS 77  --   BILITOT 1.4*  --     Cardiac Enzymes Recent Labs  Lab 02/28/18 0529  TROPONINI <0.03     RADIOLOGY:  Dg Chest 2 View  Result Date: 02/27/2018  CLINICAL DATA:  67 year old male with a history of weakness EXAM: CHEST - 2 VIEW COMPARISON:  None. FINDINGS: Cardiomediastinal silhouette within normal limits. No evidence of central vascular congestion. No pneumothorax or pleural effusion. No confluent airspace disease. No acute displaced fracture. IMPRESSION: Negative for acute cardiopulmonary disease Electronically Signed   By: Gilmer Mor D.O.   On: 02/27/2018 16:59     Management plans discussed with the patient, family and they are in agreement.  CODE STATUS:     Code Status Orders  (From admission, onward)         Start     Ordered   02/27/18 2345  Full code  Continuous     02/27/18 2344        Code Status History    This patient has a current code status but no historical code status.    Advance Directive Documentation     Most Recent Value  Type of Advance Directive  Healthcare Power of Attorney  Pre-existing out of facility DNR order (yellow form or pink MOST form)  -  "MOST" Form in Place?  -      TOTAL TIME TAKING CARE OF THIS PATIENT: 35 minutes.    Alford Highland M.D on 02/28/2018 at 4:03 PM  Between 7am to 6pm - Pager - [phone]  After 6pm go to [email or web address] - password [email or web address]  [facility] Office  [phone]  CC: Primary care physician; Danella Penton, MD

## 2018-02-28 NOTE — Progress Notes (Signed)
Pt left without reviewing discharge instructions with RN.  RN removed box and discontinued IV. RN gave discharge instructions and  instructed patient to call out once dressed and the discharge instructions would be reviewed. Pt did not call out and left the discharge instructions on the bed and left with his wife.

## 2018-02-28 NOTE — Progress Notes (Signed)
*  PRELIMINARY RESULTS* Echocardiogram 2D Echocardiogram has been performed.  Cristela BlueHege, Xylia Scherger 02/28/2018, 10:43 AM

## 2018-02-28 NOTE — Progress Notes (Signed)
Patient admitted to room 260 with the diagnosis of abnormal EKG. Alert and oriented x 4. Patient is accompanied by his wife. Denied any acute pain. Patient has [employer] Devices[employer]estyle Libre glucometer.  Full assessment to epic completed. Patient  Oriented to his room, call bell/ascom. Will continue to monitor.

## 2018-03-01 LAB — HIV ANTIBODY (ROUTINE TESTING W REFLEX): HIV Screen 4th Generation wRfx: NONREACTIVE

## 2018-04-19 LAB — NM MYOCAR MULTI W/SPECT W/WALL MOTION / EF
CHL CUP MPHR: 153 {beats}/min
CHL CUP RESTING HR STRESS: 88 {beats}/min
CSEPED: 1 min
CSEPEDS: 0 s
CSEPEW: 1 METS
LV sys vol: 20 mL
LVDIAVOL: 44 mL (ref 62–150)
Peak HR: 116 {beats}/min
Percent HR: 75 %
SDS: 0
SRS: 0
SSS: 2
TID: 0.79

## 2018-11-22 IMAGING — CR DG CHEST 2V
1 series · 2 of 2 positions shown · non-contrast
Comparison: None.

CLINICAL DATA: 67-year-old male with a history of weakness

EXAM:
CHEST - 2 VIEW

[Series 1: dg chest 2 view · 0.14mm/px · 2 of 2 slices shown]
[im 1/2]
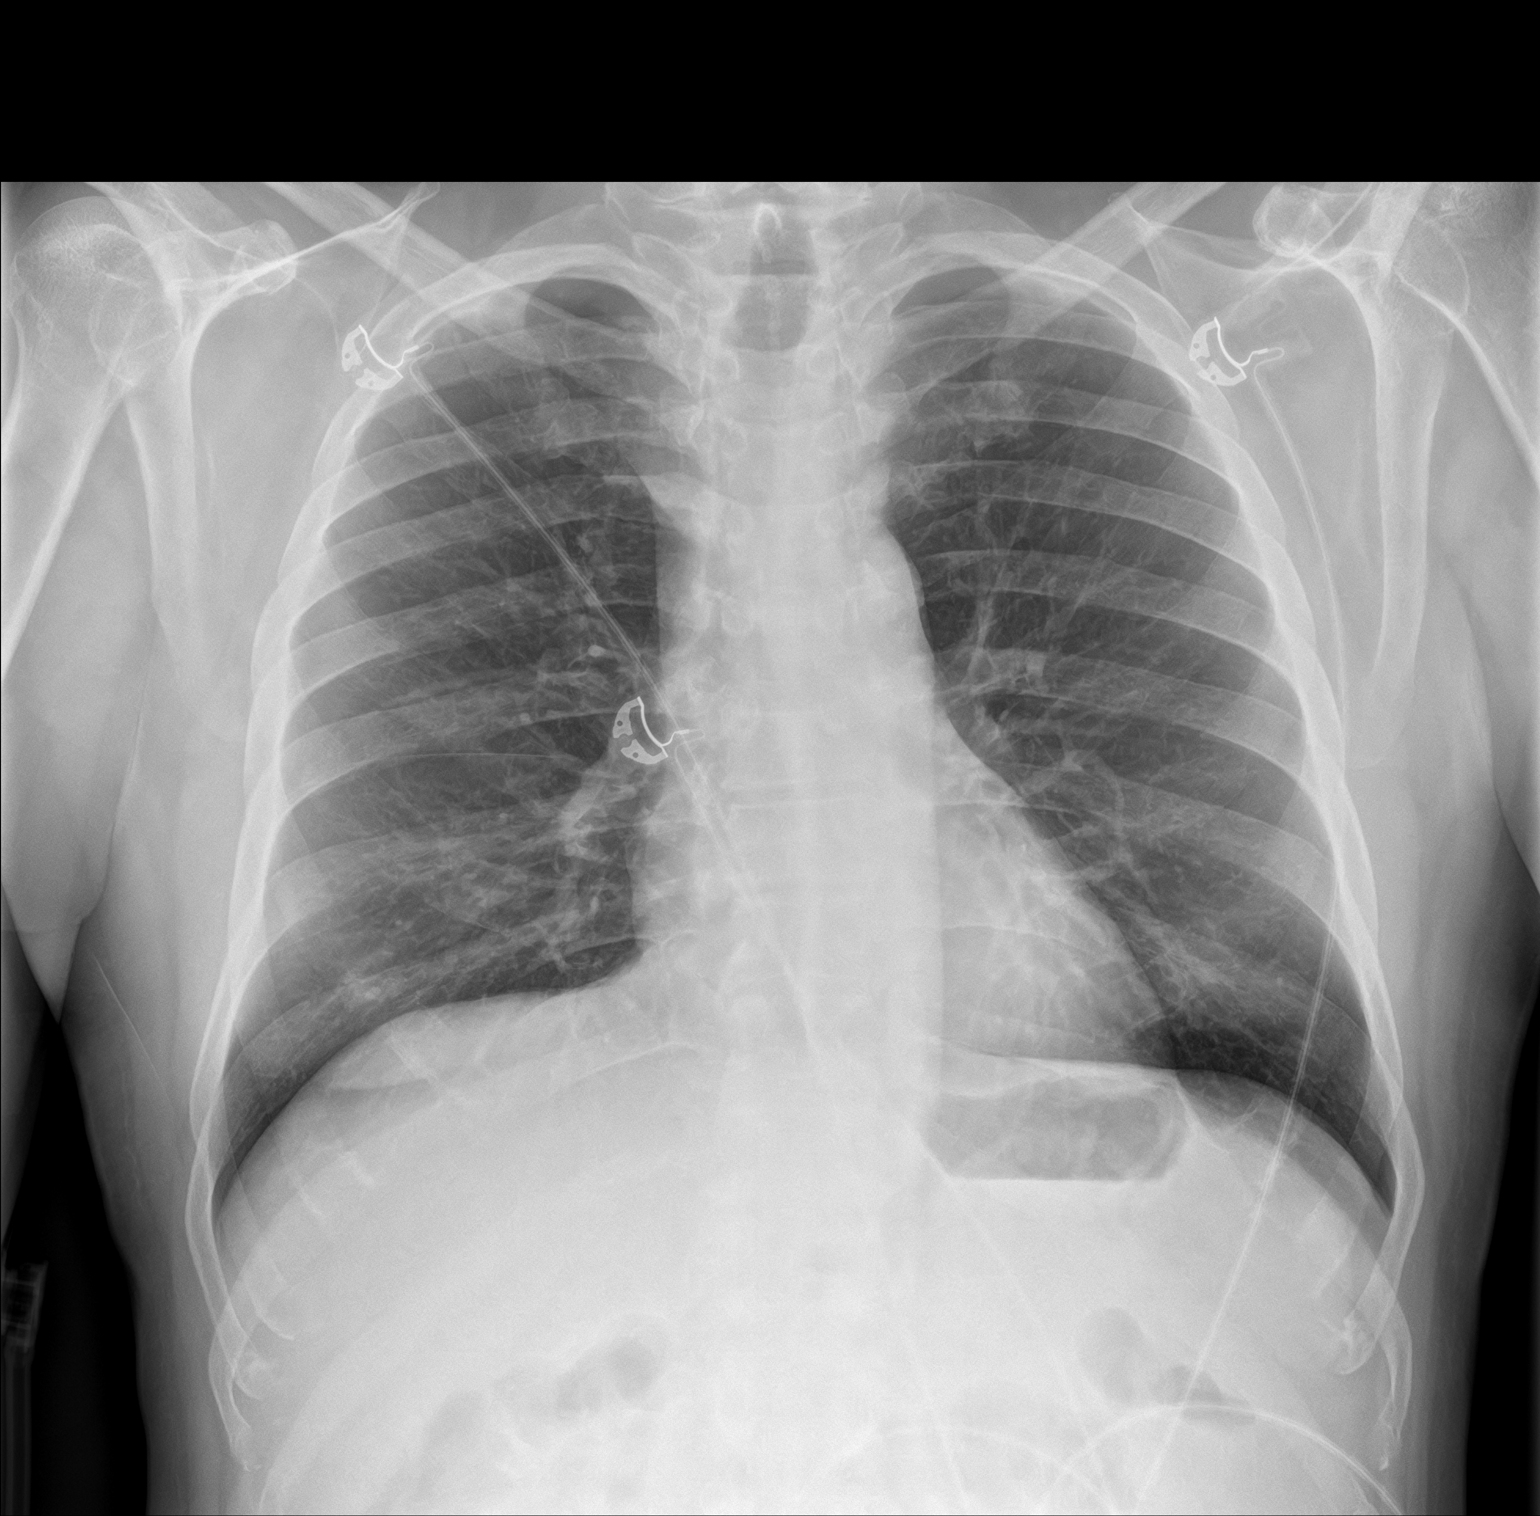
[im 2/2]
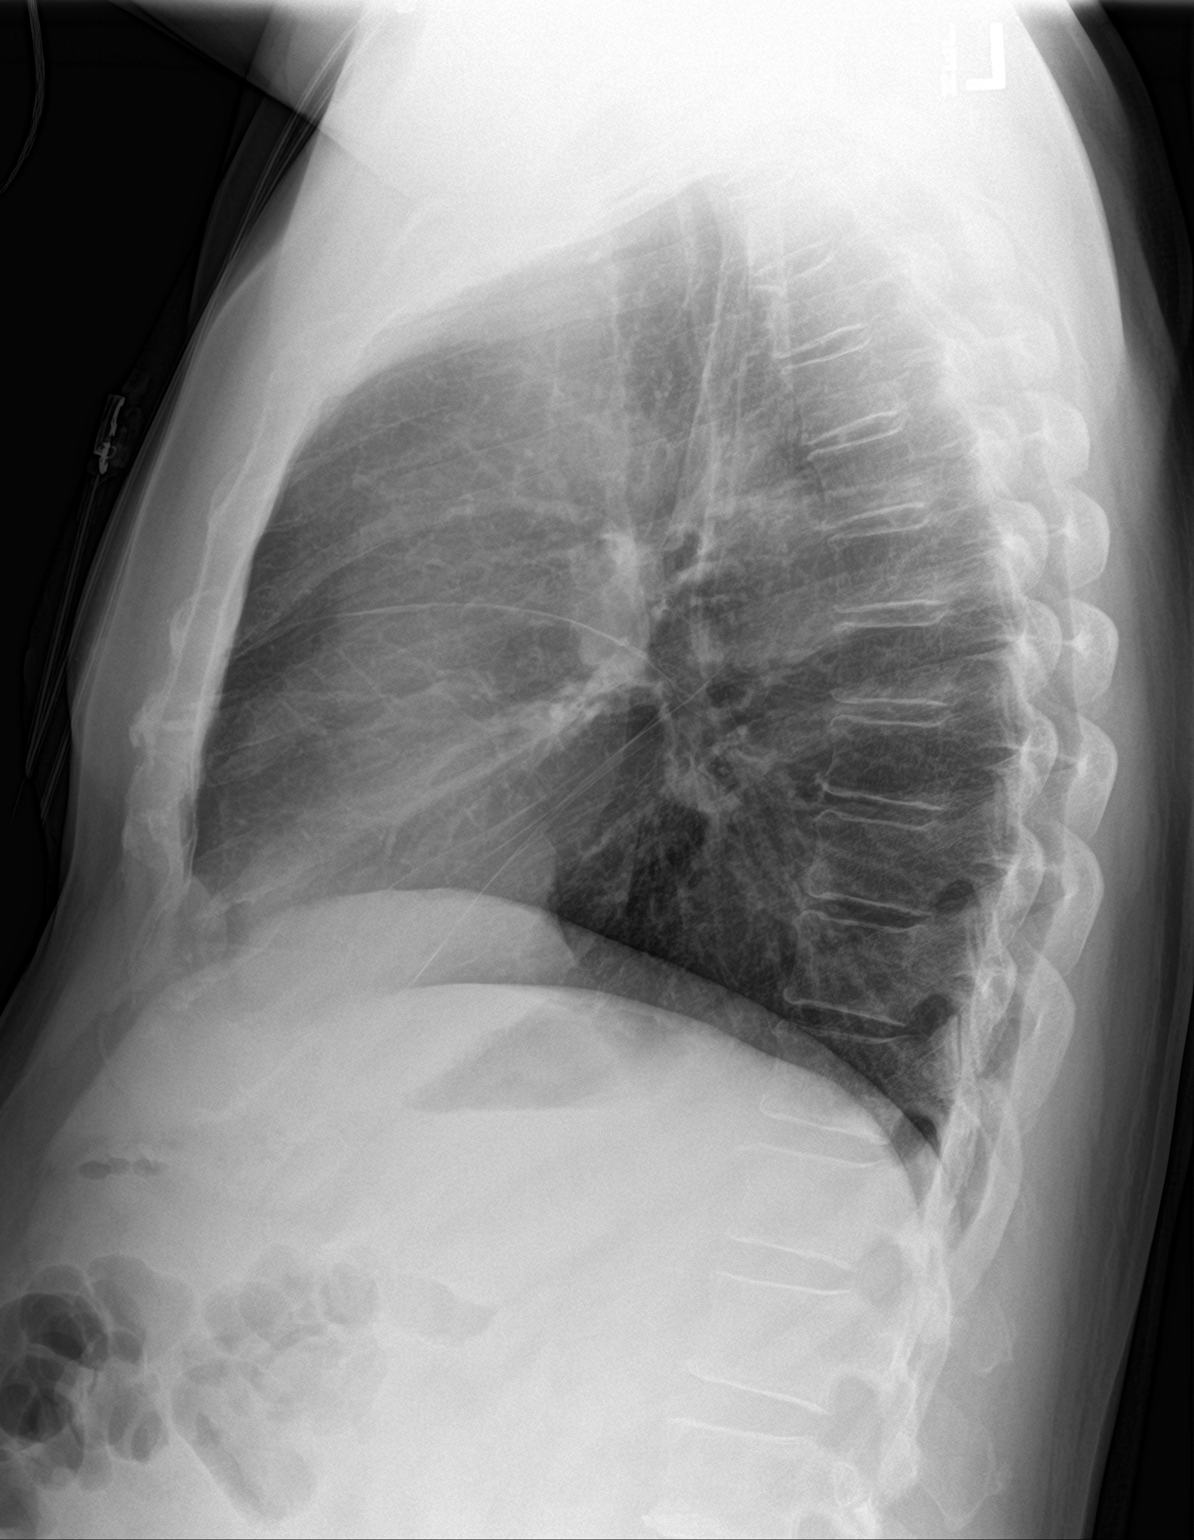

[2 of 2 positions shown; findings below may reference images not displayed]

FINDINGS: Cardiomediastinal silhouette within normal limits. No evidence of
central vascular congestion. No pneumothorax or pleural effusion. No
confluent airspace disease. No acute displaced fracture.
IMPRESSION: Negative for acute cardiopulmonary disease

## 2020-07-05 ENCOUNTER — Ambulatory Visit (INDEPENDENT_AMBULATORY_CARE_PROVIDER_SITE_OTHER): Payer: Medicare Other | Admitting: Urology

## 2020-07-05 ENCOUNTER — Encounter: Payer: Self-pay | Admitting: Urology

## 2020-07-05 ENCOUNTER — Other Ambulatory Visit: Payer: Self-pay

## 2020-07-05 VITALS — BP 169/93 | HR 75 | Ht 72.0 in | Wt 170.0 lb

## 2020-07-05 DIAGNOSIS — N5201 Erectile dysfunction due to arterial insufficiency: Secondary | ICD-10-CM

## 2020-07-05 NOTE — Progress Notes (Signed)
07/05/2020 2:56 PM   Steven Patel 20-Jul-1951 [id number]  Referring provider: Danella Penton, MD [id number] [facility] [address] [facility] [place],  [place] [address]  Chief Complaint  Patient presents with  . Erectile Dysfunction    HPI: Steven Patel is a 69 y.o. male seen at the request of Dr. Hyacinth Meeker for evaluation of erectile dysfunction.   1 year history of erectile dysfunction  Primarily difficulty maintaining erection but has occasional difficulty achieving an erection  Given trial sildenafil 100 mg which he will states was not effective but he only took 1 tab  Denies pain or curvature with erections  No decreased libido  Significant organic risk factors including hyperlipidemia, hypertension, diabetes with long-term insulin use, antihypertensive medications and prior tobacco use  No bothersome LUTS  PSA 05/2020 was 1.7   PMH: Past Medical History:  Diagnosis Date  . Cataract cortical, senile   . CRI (chronic renal insufficiency)   . Diabetes mellitus without complication (HCC)   . GERD (gastroesophageal reflux disease)   . Glaucoma   . Hepatitis    Hepatitis C  . High cholesterol   . Hyperlipidemia   . Hypertension   . Liver disease   . Long-term insulin use (HCC)   . MRSA infection    right hand     Surgical History: Past Surgical History:  Procedure Laterality Date  . COLONOSCOPY    . ESOPHAGOGASTRODUODENOSCOPY (EGD) WITH PROPOFOL N/A 09/18/2017   Procedure: ESOPHAGOGASTRODUODENOSCOPY (EGD) WITH PROPOFOL;  Surgeon: Toledo, Boykin Nearing, MD;  Location: [facility] ENDOSCOPY;  Service: Gastroenterology;  Laterality: N/A;  . HAND EXPLORATION    . WISDOM TOOTH EXTRACTION      Home Medications:  Allergies as of 07/05/2020   No Known Allergies     Medication List       Accurate as of July 05, 2020  2:56 PM. If you have any questions, ask your nurse or doctor.        amLODipine 10 MG tablet Commonly known as: NORVASC Take 1  tablet (10 mg total) by mouth daily.   carvedilol 6.25 MG tablet Commonly known as: COREG Take 1 tablet (6.25 mg total) by mouth 2 (two) times daily with a meal.   esomeprazole 40 MG capsule Commonly known as: NEXIUM Take 40 mg by mouth every evening.   insulin detemir 100 UNIT/ML FlexPen Commonly known as: LEVEMIR Inject 50 Units into the skin at bedtime.   latanoprost 0.005 % ophthalmic solution Commonly known as: XALATAN Place 1 drop into both eyes at bedtime.   losartan 50 MG tablet Commonly known as: COZAAR Take 50 mg by mouth daily.   montelukast 10 MG tablet Commonly known as: SINGULAIR Take 10 mg by mouth daily.   multivitamin tablet Take 1 tablet by mouth daily.   NOVOLOG FLEXPEN [place] Inject 5u under the skin at breakfast, 10u at lunch and 5u at dinnertime   rosuvastatin 10 MG tablet Commonly known as: CRESTOR Take 10 mg by mouth every evening.   sildenafil 100 MG tablet Commonly known as: VIAGRA Take 100 mg by mouth daily as needed for erectile dysfunction.   sucralfate 1 g tablet Commonly known as: CARAFATE Take 1 g by mouth 4 (four) times daily -  with meals and at bedtime.       Allergies: No Known Allergies  Family History: Family History  Problem Relation Age of Onset  . Cataracts Mother   . Glaucoma Mother   . Diabetes Brother  Social History:  reports that he has quit smoking. His smoking use included cigarettes. He has a 6.00 pack-year smoking history. He has never used smokeless tobacco. He reports current alcohol use of about 5.0 standard drinks of alcohol per week. He reports that he does not use drugs.   Physical Exam: BP (!) 169/93   Pulse 75   Ht 6' (1.829 m)   Wt 170 lb (77.1 kg)   BMI 23.06 kg/m   Constitutional:  Alert, No acute distress. HEENT: [place] AT, moist mucus membranes.  Trachea midline, no masses. Cardiovascular: No clubbing, cyanosis, or edema. Respiratory: Normal respiratory effort, no increased work of  breathing. GI: Abdomen is soft, nontender, nondistended, no abdominal masses GU: Phallus with dorsal plaque proximally; testes descended bilaterally without masses or tenderness Skin: No rashes, bruises or suspicious lesions. Neurologic: Grossly intact, no focal deficits, moving all 4 extremities. Psychiatric: Normal mood and affect.   Assessment & Plan:    1.  Erectile dysfunction  We discussed that 70% of erectile dysfunction etiology is related to arterial insufficiency and he does have significant organic risk factors  He states he was given prescription for 5 tabs of sildenafil 100 mg; recommend he try on at least 5 occasions before determining medication failure  We discussed second line options including intracavernosal injections and vacuum erection devices  Third line options of penile implant surgery was discussed  He was given literature and will call back if interested in any of these options   Riki Altes, MD  [facility] [address] [place], [place] [address] [phone]

## 2020-07-06 ENCOUNTER — Encounter: Payer: Self-pay | Admitting: Urology

## 2020-07-12 ENCOUNTER — Encounter: Payer: Self-pay | Admitting: Urology

## 2020-08-17 ENCOUNTER — Ambulatory Visit: Payer: Self-pay | Admitting: Urology

## 2020-08-24 NOTE — Progress Notes (Signed)
We were unable to perform ICI titrations today due to patient's uncontrolled blood pressure.  He is advised to contact his primary care physician to get his blood pressure to the point of 140/90 or less in order for [place] to safely proceed with ICI.

## 2020-08-25 ENCOUNTER — Other Ambulatory Visit: Payer: Self-pay

## 2020-08-25 ENCOUNTER — Encounter: Payer: Self-pay | Admitting: Urology

## 2020-08-25 ENCOUNTER — Ambulatory Visit (INDEPENDENT_AMBULATORY_CARE_PROVIDER_SITE_OTHER): Payer: Medicare Other | Admitting: Urology

## 2020-08-25 VITALS — BP 168/98 | HR 71 | Ht 72.0 in | Wt 170.0 lb

## 2020-08-25 DIAGNOSIS — N5201 Erectile dysfunction due to arterial insufficiency: Secondary | ICD-10-CM

## 2020-08-25 NOTE — Patient Instructions (Signed)
Please evaluate patients blood pressure. Blood pressure must be under 140/ 90 in order for [place] to give patient erection injections.

## 2020-09-13 NOTE — Progress Notes (Signed)
Mr. Alper presents today for a ICI titration.  He is no longer having spontaneous erections. He has had moderate response to PDE5i's.   He denies any history of sickle cell anemia or trait, a history of multiple myeloma or a history of leukemia.  He has not taken trazodone or a PDE5i's today.    Patient's left corpus cavernosum is identified.  An area near the base of the penis is cleansed with rubbing alcohol.  Careful to avoid the dorsal vein, 0.25 Edex 20 mcg is injected at a 90 degree angle into the left corpus cavernosum near the base of the penis.  Patient experienced a penile fullness in 15 minutes.    Patient's right corpus cavernosum is identified.  An area near the base of the penis is cleansed with rubbing alcohol.  Careful to avoid the dorsal vein, 0.25 Edex 20 mcg is injected at a 90 degree angle into the right corpus cavernosum near the base of the penis.  Patient experienced a penile fullness in 15 minutes.    I also noted a dorsal curve starting to emerge.  A dorsal Peyronie's plaque is palpated.  Patient's left corpus cavernosum is identified.  An area near the base of the penis is cleansed with rubbing alcohol.  Careful to avoid the dorsal vein, 0.25 Edex 20 mcg is injected at a 90 degree angle into the left corpus cavernosum near the base of the penis.  Patient experienced a penile fullness in 15 minutes.    Patient's right corpus cavernosum is identified.  An area near the base of the penis is cleansed with rubbing alcohol.  Careful to avoid the dorsal vein, 0.25 Edex 20 mcg is injected at a 90 degree angle into the right corpus cavernosum near the base of the penis.  Patient experienced a penile fullness in 15 minutes.    Advised patient of the condition of priapism, painful erection lasting for more than four hours, and to contact the office immediately or seek treatment in the ED   Discussed with the patient his next steps since the injection of the whole syringe of the Edex  20 mcg was not effective.  We discussed either trying the Trimix test dose, which would be an out-of-pocket cost for him, or referring him on to either Dr. Gloriann Loan in [place] or Dr. Francesca Jewett in [place] for discussion on IPP.  He would like to try the Trimix test dose, so we will call in the prescription for him and schedule him for another titration appointment.  I spent 20 minutes on the day of the encounter to include pre-visit record review, face-to-face time with the patient, and post-visit ordering of tests.

## 2020-09-14 ENCOUNTER — Encounter: Payer: Self-pay | Admitting: Urology

## 2020-09-14 ENCOUNTER — Other Ambulatory Visit: Payer: Self-pay

## 2020-09-14 ENCOUNTER — Ambulatory Visit (INDEPENDENT_AMBULATORY_CARE_PROVIDER_SITE_OTHER): Payer: Medicare Other | Admitting: Urology

## 2020-09-14 VITALS — BP 108/73 | HR 77 | Ht 72.0 in | Wt 170.0 lb

## 2020-09-14 DIAGNOSIS — N5201 Erectile dysfunction due to arterial insufficiency: Secondary | ICD-10-CM | POA: Diagnosis not present

## 2020-09-14 MED ORDER — AMBULATORY NON FORMULARY MEDICATION: 0 refills | Status: AC

## 2020-09-27 NOTE — Progress Notes (Signed)
Steven Patel presents today for a ICI titration.  He is no longer having spontaneous erections. He has had moderate response to PDE5i's.   He denies any history of sickle cell anemia or trait, a history of multiple myeloma or a history of leukemia.  He has not taken trazodone or a PDE5i's today.    Patient's left corpus cavernosum is identified.  An area near the base of the penis is cleansed with rubbing alcohol.  Careful to avoid the dorsal vein, 2 mcg of Trimix (papaverine 30 mg, phentolamine 1 mg and prostaglandin E1 10 mcg, Lot # [id number]>_3<\/SLHTDSKAJGOTLXBW>_6  exp # 10/28/2020)  is injected at a 90 degree angle into the left corpus cavernosum near the base of the penis.  Patient experienced a penile fullness in 15 minutes.    Patient's right corpus cavernosum is identified.  An area near the base of the penis is cleansed with rubbing alcohol.  Careful to avoid the dorsal vein, 2 mcg of Trimix (papaverine 30 mg, phentolamine 1 mg and prostaglandin E1 10 mcg, Lot # [id number]  exp # 10/28/2020)  Patient experienced a penile fullness in 15 minutes.    I also noted a dorsal curve starting to emerge.  A dorsal Peyronie's plaque is palpated near the base ~ 2 cm x 1 cm   Patient's left corpus cavernosum is identified.  An area near the base of the penis is cleansed with rubbing alcohol.  Careful to avoid the dorsal vein,  2 mcg of Trimix (papaverine 30 mg, phentolamine 1 mg and prostaglandin E1 10 mcg, Lot # [id number]>_2<\/[id number]>_9  exp # 10/28/2020)  Patient experienced a penile fullness in 15 minutes.    Patient's right corpus cavernosum is identified.  An area near the base of the penis is cleansed with rubbing alcohol.  Careful to avoid the dorsal vein, 2 mcg of Trimix (papaverine 30 mg, phentolamine 1 mg and prostaglandin E1 10 mcg, Lot # [id number]  exp # 10/28/2020) Patient experienced a penile fullness in 15 minutes.    If patient's erection was achieved, I imagine he would have a 30 to 40 degree dorsal curvature.    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  He would like a referral to Dr. Francesca Jewett at [facility] at this time.  Referral placed.  I spent 20 minutes on the day of the encounter to include pre-visit record review, face-to-face time with the patient, and post-visit ordering of tests.

## 2020-09-28 ENCOUNTER — Ambulatory Visit (INDEPENDENT_AMBULATORY_CARE_PROVIDER_SITE_OTHER): Payer: Medicare Other | Admitting: Urology

## 2020-09-28 ENCOUNTER — Other Ambulatory Visit: Payer: Self-pay

## 2020-09-28 ENCOUNTER — Encounter: Payer: Self-pay | Admitting: Urology

## 2020-09-28 VITALS — BP 142/87 | HR 69 | Ht 72.0 in | Wt 170.0 lb

## 2020-09-28 DIAGNOSIS — N5201 Erectile dysfunction due to arterial insufficiency: Secondary | ICD-10-CM | POA: Diagnosis not present

## 2020-09-28 DIAGNOSIS — N486 Induration penis plastica: Secondary | ICD-10-CM

## 2020-10-28 ENCOUNTER — Emergency Department
Admission: EM | Admit: 2020-10-28 | Discharge: 2020-10-28 | Disposition: A | Discharge status: Home / Self Care | Payer: Medicare Other | Attending: Emergency Medicine | Admitting: Emergency Medicine

## 2020-10-28 ENCOUNTER — Emergency Department: Payer: Medicare Other

## 2020-10-28 ENCOUNTER — Other Ambulatory Visit: Payer: Self-pay

## 2020-10-28 ENCOUNTER — Encounter: Payer: Self-pay | Admitting: Emergency Medicine

## 2020-10-28 DIAGNOSIS — W228XXA Striking against or struck by other objects, initial encounter: Secondary | ICD-10-CM | POA: Diagnosis not present

## 2020-10-28 DIAGNOSIS — Z87891 Personal history of nicotine dependence: Secondary | ICD-10-CM | POA: Diagnosis not present

## 2020-10-28 DIAGNOSIS — R42 Dizziness and giddiness: Secondary | ICD-10-CM

## 2020-10-28 DIAGNOSIS — S0101XA Laceration without foreign body of scalp, initial encounter: Secondary | ICD-10-CM | POA: Diagnosis not present

## 2020-10-28 DIAGNOSIS — Y9301 Activity, walking, marching and hiking: Secondary | ICD-10-CM | POA: Diagnosis not present

## 2020-10-28 DIAGNOSIS — Z794 Long term (current) use of insulin: Secondary | ICD-10-CM | POA: Diagnosis not present

## 2020-10-28 DIAGNOSIS — Z79899 Other long term (current) drug therapy: Secondary | ICD-10-CM | POA: Diagnosis not present

## 2020-10-28 DIAGNOSIS — E119 Type 2 diabetes mellitus without complications: Secondary | ICD-10-CM | POA: Insufficient documentation

## 2020-10-28 DIAGNOSIS — I10 Essential (primary) hypertension: Secondary | ICD-10-CM | POA: Insufficient documentation

## 2020-10-28 DIAGNOSIS — S0990XA Unspecified injury of head, initial encounter: Secondary | ICD-10-CM | POA: Diagnosis present

## 2020-10-28 DIAGNOSIS — R9431 Abnormal electrocardiogram [ECG] [EKG]: Secondary | ICD-10-CM | POA: Diagnosis not present

## 2020-10-28 DIAGNOSIS — Y92000 Kitchen of unspecified non-institutional (private) residence as  the place of occurrence of the external cause: Secondary | ICD-10-CM | POA: Insufficient documentation

## 2020-10-28 LAB — URINALYSIS, COMPLETE (UACMP) WITH MICROSCOPIC
Bacteria, UA: NONE SEEN
Bilirubin Urine: NEGATIVE
Glucose, UA: NEGATIVE mg/dL
Hgb urine dipstick: NEGATIVE
Ketones, ur: NEGATIVE mg/dL
Leukocytes,Ua: NEGATIVE
Nitrite: NEGATIVE
Protein, ur: NEGATIVE mg/dL
Specific Gravity, Urine: 1.013 (ref 1.005–1.030)
Squamous Epithelial / HPF: NONE SEEN (ref 0–5)
pH: 5 (ref 5.0–8.0)

## 2020-10-28 LAB — BASIC METABOLIC PANEL
Anion gap: 12 (ref 5–15)
BUN: 12 mg/dL (ref 8–23)
CO2: 19 mmol/L — ABNORMAL LOW (ref 22–32)
Calcium: 8.8 mg/dL — ABNORMAL LOW (ref 8.9–10.3)
Chloride: 98 mmol/L (ref 98–111)
Creatinine, Ser: 1.1 mg/dL (ref 0.61–1.24)
GFR, Estimated: >60 mL/min (ref >60)
Glucose, Bld: 173 mg/dL — ABNORMAL HIGH (ref 70–99)
Potassium: 3.5 mmol/L (ref 3.5–5.1)
Sodium: 129 mmol/L — ABNORMAL LOW (ref 135–145)

## 2020-10-28 LAB — CBC
HCT: 37.3 % — ABNORMAL LOW (ref 39.0–52.0)
Hemoglobin: 13 g/dL (ref 13.0–17.0)
MCH: 32.8 pg (ref 26.0–34.0)
MCHC: 34.9 g/dL (ref 30.0–36.0)
MCV: 94.2 fL (ref 80.0–100.0)
Platelets: 175 10*3/uL (ref 150–400)
RBC: 3.96 MIL/uL — ABNORMAL LOW (ref 4.22–5.81)
RDW: 12.2 % (ref 11.5–15.5)
WBC: 11.4 10*3/uL — ABNORMAL HIGH (ref 4.0–10.5)
nRBC: 0 % (ref 0.0–0.2)

## 2020-10-28 LAB — TROPONIN I (HIGH SENSITIVITY)
Troponin I (High Sensitivity): 8 ng/L (ref <18)
Troponin I (High Sensitivity): 9 ng/L (ref <18)

## 2020-10-28 LAB — CBG MONITORING, ED: Glucose-Capillary: 210 mg/dL — ABNORMAL HIGH (ref 70–99)

## 2020-10-28 NOTE — ED Provider Notes (Signed)
[facility] Emergency Department Provider Note  ____________________________________________  Time seen: Approximately 4:17 AM  I have reviewed the triage vital signs and the nursing notes.   HISTORY  Chief Complaint Dizziness   HPI Steven Patel is a 70 y.o. male with a history of diabetes, chronic kidney disease, glaucoma, hepatitis C, hypertension, hyperlipidemia who presents for evaluation of head trauma.  Patient reports that he was walking to his kitchen to get his blood sugar tested when he had a brief episode of feeling off balance.  He fell onto a kitchen cabinet and hit his head.  He denies feeling lightheaded.  He reports that the episode of dizziness was very brief and resolved without intervention.  Has not had any more episodes of dizziness.  He denies headache, neck pain, back pain, chest pain.  He did not fall on the ground.  He did sustain laceration to the back of his head.  Patient reports that he was in his usual state of health before this fall happened.  He is complaining of having intermittent episodes of dizziness for about a month.  He describes these episodes of feeling lightheaded when she stands up really fast.  No syncope.  He has not seen his doctor yet for these episodes.  He has had no fever or chills, no cough, no vomiting or diarrhea.   Past Medical History:  Diagnosis Date  . Cataract cortical, senile   . CRI (chronic renal insufficiency)   . Diabetes mellitus without complication (HCC)   . GERD (gastroesophageal reflux disease)   . Glaucoma   . Hepatitis    Hepatitis C  . High cholesterol   . Hyperlipidemia   . Hypertension   . Liver disease   . Long-term insulin use (HCC)   . MRSA infection    right hand     Patient Active Problem List   Diagnosis Date Noted  . Weakness 02/27/2018  . HTN (hypertension) 02/27/2018  . HLD (hyperlipidemia) 02/27/2018  . GERD (gastroesophageal reflux disease) 02/27/2018  . Diabetes  (HCC) 02/27/2018  . Abnormal EKG 02/27/2018    Past Surgical History:  Procedure Laterality Date  . COLONOSCOPY    . ESOPHAGOGASTRODUODENOSCOPY (EGD) WITH PROPOFOL N/A 09/18/2017   Procedure: ESOPHAGOGASTRODUODENOSCOPY (EGD) WITH PROPOFOL;  Surgeon: [place], Boykin Nearing, MD;  Location: [facility] ENDOSCOPY;  Service: Gastroenterology;  Laterality: N/A;  . HAND EXPLORATION    . WISDOM TOOTH EXTRACTION      Prior to Admission medications   Medication Sig Start Date End Date Taking? Authorizing Provider  AMBULATORY NON FORMULARY MEDICATION Trimix (30/1/10)-(Pap/Phent/PGE)  Test Dose  53ml vial   Qty #3 Refills 0  [facility] [phone] Fax [phone] 09/14/20   Michiel Cowboy A, PA-C  amLODipine (NORVASC) 5 MG tablet Take 5 mg by mouth daily. 09/06/20   [provider]  atorvastatin (LIPITOR) 10 MG tablet Take 10 mg by mouth daily. 08/09/20   [provider]  carvedilol (COREG) 6.25 MG tablet Take 1 tablet (6.25 mg total) by mouth 2 (two) times daily with a meal. 02/28/18   Alford Highland, MD  esomeprazole (NEXIUM) 40 MG capsule Take 40 mg by mouth every evening.     [provider]  insulin detemir (LEVEMIR) 100 UNIT/ML FlexPen Inject 50 Units into the skin at bedtime.     [provider]  latanoprost (XALATAN) 0.005 % ophthalmic solution Place 1 drop into both eyes at bedtime.    [provider]  losartan (COZAAR)  50 MG tablet Take 50 mg by mouth daily.    [provider]  magnesium oxide (MAG-OX) 400 MG tablet Take by mouth.    [provider]  montelukast (SINGULAIR) 10 MG tablet Take 10 mg by mouth daily.    [provider]  Multiple Vitamin (MULTIVITAMIN) tablet Take 1 tablet by mouth daily.    [provider]  NOVOLOG FLEXPEN 100 UNIT/ML FlexPen Inject into the skin. 08/15/20   [provider]  ondansetron (ZOFRAN-ODT) 4 MG disintegrating tablet SMARTSIG:1 Tablet(s) By Mouth 4-5 Times  Daily 08/16/20   [provider]  rosuvastatin (CRESTOR) 10 MG tablet Take 10 mg by mouth every evening.     [provider]  sildenafil (VIAGRA) 100 MG tablet Take 100 mg by mouth daily as needed for erectile dysfunction.    [provider]  sucralfate (CARAFATE) 1 g tablet Take 1 g by mouth 4 (four) times daily -  with meals and at bedtime.     [provider]    Allergies Patient has no known allergies.  Family History  Problem Relation Age of Onset  . Cataracts Mother   . Glaucoma Mother   . Diabetes Brother     Social History Social History   Tobacco Use  . Smoking status: Former Smoker    Packs/day: 2.00    Years: 3.00    Pack years: 6.00    Types: Cigarettes  . Smokeless tobacco: Never Used  . Tobacco comment: last attempted to quit 08/18/1995  Vaping Use  . Vaping Use: Never used  Substance Use Topics  . Alcohol use: Yes    Alcohol/week: 5.0 standard drinks    Types: 1 Glasses of wine, 2 Cans of beer, 1 Shots of liquor, 1 Standard drinks or equivalent per week    Comment: per week   . Drug use: No    Review of Systems  Constitutional: Negative for fever. Eyes: Negative for visual changes. ENT: Negative for facial injury or neck injury Cardiovascular: Negative for chest injury. Respiratory: Negative for shortness of breath. Negative for chest wall injury. Gastrointestinal: Negative for abdominal pain or injury. Genitourinary: Negative for dysuria. Musculoskeletal: Negative for back injury, negative for arm or leg pain. Skin: + scalp laceration Neurological: + head injury, dizziness   ____________________________________________   PHYSICAL EXAM:  VITAL SIGNS: ED Triage Vitals  Enc Vitals Group     BP 10/28/20 0018 (!) 149/93     Pulse Rate 10/28/20 0018 83     Resp 10/28/20 0018 16     Temp 10/28/20 0018 99 F (37.2 C)     Temp Source 10/28/20 0018 Oral     SpO2 10/28/20 0018 98 %     Weight 10/28/20 0017 170 lb  (77.1 kg)     Height 10/28/20 0017 6' (1.829 m)     Head Circumference --      Peak Flow --      Pain Score 10/28/20 0017 0     Pain Loc --      Pain Edu? --      Excl. in GC? --      Constitutional: Alert and oriented. No acute distress. Does not appear intoxicated. HEENT Head: Normocephalic and atraumatic. Linear occipital laceration  Face: No facial bony tenderness. Stable midface Ears: No hemotympanum bilaterally. No Battle sign Eyes: No eye injury. PERRL. No raccoon eyes Nose: Nontender. No epistaxis. No rhinorrhea Mouth/Throat: Mucous membranes are moist. No oropharyngeal blood. No dental injury. Airway  patent without stridor. Normal voice. Neck: no C-collar. No midline c-spine tenderness.  Cardiovascular: Normal rate, regular rhythm. Normal and symmetric distal pulses are present in all extremities. Pulmonary/Chest: Chest wall is stable and nontender to palpation/compression. Normal respiratory effort. Breath sounds are normal. No crepitus.  Abdominal: Soft, nontender, non distended. Musculoskeletal: Nontender with normal full range of motion in all extremities. No deformities. No thoracic or lumbar midline spinal tenderness. Pelvis is stable. Skin: Skin is warm, dry and intact. No abrasions or contutions. Psychiatric: Speech and behavior are appropriate. Neurological: Normal speech and language. Moves all extremities to command. No gross focal neurologic deficits are appreciated.  Glascow Coma Score: 4 - Opens eyes on own 6 - Follows simple motor commands 5 - Alert and oriented GCS: 15   ____________________________________________   LABS (all labs ordered are listed, but only abnormal results are displayed)  Labs Reviewed  BASIC METABOLIC PANEL - Abnormal; Notable for the following components:      Result Value   Sodium 129 (*)    CO2 19 (*)    Glucose, Bld 173 (*)    Calcium 8.8 (*)    All other components within normal limits  CBC - Abnormal; Notable for the  following components:   WBC 11.4 (*)    RBC 3.96 (*)    HCT 37.3 (*)    All other components within normal limits  CBG MONITORING, ED - Abnormal; Notable for the following components:   Glucose-Capillary 210 (*)    All other components within normal limits  URINALYSIS, COMPLETE (UACMP) WITH MICROSCOPIC  TROPONIN I (HIGH SENSITIVITY)  TROPONIN I (HIGH SENSITIVITY)   ____________________________________________  EKG  ED ECG REPORT I, Nita Sickle, the attending physician, personally viewed and interpreted this ECG.  Normal sinus rhythm, rate of 81, borderline prolonged QTC, deep T wave inversions in lateral and inferior leads with no ST elevation.  Comparison to prior from 2019 shows presence of T wave inversions on the same leads however the ones today look deeper. ____________________________________________  RADIOLOGY  I have personally reviewed the images performed during this visit and I agree with the Radiologist's read.   Interpretation by Radiologist:  CT HEAD WO CONTRAST  Result Date: 10/28/2020 CLINICAL DATA:  Dizziness for a month. Fell tonight with laceration to the posterior head. No loss of consciousness. EXAM: CT HEAD WITHOUT CONTRAST TECHNIQUE: Contiguous axial images were obtained from the base of the skull through the vertex without intravenous contrast. COMPARISON:  None. FINDINGS: Brain: No evidence of acute infarction, hemorrhage, hydrocephalus, extra-axial collection or mass lesion/mass effect. Diffuse cerebral atrophy. Mild ventricular dilatation consistent with central atrophy. Low-attenuation changes in the deep white matter consistent with small vessel ischemia. Choroid plexus calcifications. Vascular: Moderate intracranial arterial vascular calcifications. Skull: Calvarium appears intact. Sinuses/Orbits: Paranasal sinuses and mastoid air cells are clear. Other: Soft tissue gas in the subcutaneous scalp over the posterior vertex, likely corresponding to the  known laceration. IMPRESSION: 1. No acute intracranial abnormalities. 2. Chronic atrophy and small vessel ischemia. 3. Soft tissue gas in the subcutaneous scalp over the posterior vertex, likely corresponding to the known laceration. Electronically Signed   By: Burman Nieves M.D.   On: 10/28/2020 00:47     ____________________________________________   PROCEDURES  Procedure(s) performed:yes .1-3 Lead EKG Interpretation Performed by: Nita Sickle, MD Authorized by: Nita Sickle, MD     Interpretation: abnormal     ECG rate assessment: normal     Rhythm: sinus rhythm     Ectopy:  none   ..Laceration Repair  Date/Time: 10/28/2020 4:28 AM Performed by: Nita Sickle, MD Authorized by: Nita Sickle, MD   Consent:    Consent obtained:  Verbal   Consent given by:  Patient   Risks discussed:  Infection, pain, retained foreign body, poor cosmetic result and poor wound healing Laceration details:    Location:  Scalp   Length (cm):  2 Pre-procedure details:    Preparation:  Imaging obtained to evaluate for foreign bodies Exploration:    Hemostasis achieved with:  Direct pressure   Imaging outcome: foreign body not noted     Wound exploration: entire depth of wound visualized     Wound extent: no fascia violation noted, no foreign bodies/material noted, no underlying fracture noted and no vascular damage noted     Contaminated: no   Treatment:    Area cleansed with:  Saline and povidone-iodine   Amount of cleaning:  Extensive   Irrigation solution:  Sterile saline   Visualized foreign bodies/material removed: no   Skin repair:    Repair method:  Staples   Number of staples:  1 Approximation:    Approximation:  Close Repair type:    Repair type:  Simple Post-procedure details:    Dressing:  Sterile dressing   Procedure completion:  Tolerated well, no immediate complications   Critical Care performed:   None ____________________________________________   INITIAL IMPRESSION / ASSESSMENT AND PLAN / ED COURSE   70 y.o. male with a history of diabetes, chronic kidney disease, glaucoma, hepatitis C, hypertension, hyperlipidemia who presents for evaluation of head trauma after hitting his head on a kitchen cabinet due to a short-lived episode of feeling off balance.  Patient is neurologically intact, has a shallow occipital laceration which was repaired per procedure note above.  Last tetanus shot was 2014.  No signs or symptoms of basilar skull fracture, no midline spine tenderness.  EKG with no signs of dysrhythmias, patient does have T wave inversions in inferior lateral leads which were present when compared to prior from 2019 but deeper.  He denies any chest pain or shortness of breath.  High-sensitivity troponin x2 .  Labs showing mild hyperglycemia no evidence of DKA, stable kidney function with no signs of significant dehydration or AKI.  Sodium of 129 which seems to be patient's baseline with no other electrolyte derangements, mild leukocytosis of 11.4 with no specific signs or symptoms of infection, no signs of anemia.  Head CT visualized by me with no intracranial traumatic injuries, confirmed by radiology.  Patient monitored on telemetry with no signs of dysrhythmias.  Due to abnormal EKG and the fact the patient has been having these episodes of dizziness will refer him to cardiology.  Patient has been seen by Dr. Juliann Pares in 2019.  Discussed wound care with patient and his wife and removal of staple within a week.  Discussed signs of infection and recommended return to the emergency room if these develop.  Patient placed on telemetry for monitoring for any signs of dysrhythmias which could be causing patient's dizziness but none were seen.  History is gathered from patient and his wife, plan discussed with both of him.  Old medical records reviewed         ____________________________________________  Please note:  Patient was evaluated in Emergency Department today for the symptoms described in the history of present illness. Patient was evaluated in the context of the global COVID-19 pandemic, which necessitated consideration that the patient might be at risk for  infection with the SARS-CoV-2 virus that causes COVID-19. Institutional protocols and algorithms that pertain to the evaluation of patients at risk for COVID-19 are in a state of rapid change based on information released by regulatory bodies including the CDC and federal and state organizations. These policies and algorithms were followed during the patient's care in the ED.  Some ED evaluations and interventions may be delayed as a result of limited staffing during the pandemic.   ____________________________________________   FINAL CLINICAL IMPRESSION(S) / ED DIAGNOSES   Final diagnoses:  Dizziness  Traumatic injury of head, initial encounter  Laceration of scalp, initial encounter  Abnormal EKG      NEW MEDICATIONS STARTED DURING THIS VISIT:  ED Discharge Orders    None       Note:  This document was prepared using Dragon voice recognition software and may include unintentional dictation errors.    Don Perking, [place], MD 10/28/20 [phone]

## 2020-10-28 NOTE — Discharge Instructions (Addendum)
You received 1 staple onto your scalp laceration.  The staple needs to be removed in a week.  Follow-up with your doctor return to the ER for that.  If you notice pus, redness or warmth of the skin surrounding the cut or fever these are signs of infection and need to return to the emergency room.  Due to episodes of dizziness and your abnormal EKG please make sure to follow-up with Dr. Juliann Pares as soon as possible.  Return to the emergency room for chest pain or shortness of breath or if you pass out.

## 2020-10-28 NOTE — ED Triage Notes (Signed)
Pt to ED via EMS from home c/o dizziness tonight.  States intermittent dizziness x1 month and tonight while walking to kitchen got dizzy and fell hitting head on cabinet, pt with approx 4cm lac to posterior head.  Pt denies LOC or pain.  Pt A&Ox4, in NAD at this time.

## 2021-07-27 ENCOUNTER — Other Ambulatory Visit: Payer: Self-pay

## 2021-07-27 ENCOUNTER — Ambulatory Visit: Payer: Medicare Other | Admitting: Anesthesiology

## 2021-07-27 ENCOUNTER — Ambulatory Visit
Admission: RE | Admit: 2021-07-27 | Discharge: 2021-07-27 | Disposition: A | Discharge status: Home / Self Care | Payer: Medicare Other | Attending: Internal Medicine | Admitting: Internal Medicine

## 2021-07-27 ENCOUNTER — Encounter: Payer: Self-pay | Admitting: Internal Medicine

## 2021-07-27 ENCOUNTER — Encounter
Admission: RE | Disposition: A | Discharge status: Home / Self Care | Payer: Self-pay | Source: Home / Self Care | Attending: Internal Medicine

## 2021-07-27 DIAGNOSIS — Z1211 Encounter for screening for malignant neoplasm of colon: Secondary | ICD-10-CM | POA: Diagnosis not present

## 2021-07-27 DIAGNOSIS — I129 Hypertensive chronic kidney disease with stage 1 through stage 4 chronic kidney disease, or unspecified chronic kidney disease: Secondary | ICD-10-CM | POA: Insufficient documentation

## 2021-07-27 DIAGNOSIS — E1122 Type 2 diabetes mellitus with diabetic chronic kidney disease: Secondary | ICD-10-CM | POA: Insufficient documentation

## 2021-07-27 DIAGNOSIS — K640 First degree hemorrhoids: Secondary | ICD-10-CM | POA: Diagnosis not present

## 2021-07-27 DIAGNOSIS — K219 Gastro-esophageal reflux disease without esophagitis: Secondary | ICD-10-CM | POA: Insufficient documentation

## 2021-07-27 DIAGNOSIS — Z794 Long term (current) use of insulin: Secondary | ICD-10-CM | POA: Insufficient documentation

## 2021-07-27 DIAGNOSIS — K5730 Diverticulosis of large intestine without perforation or abscess without bleeding: Secondary | ICD-10-CM | POA: Diagnosis not present

## 2021-07-27 DIAGNOSIS — N189 Chronic kidney disease, unspecified: Secondary | ICD-10-CM | POA: Insufficient documentation

## 2021-07-27 HISTORY — PX: COLONOSCOPY: SHX5424

## 2021-07-27 LAB — GLUCOSE, CAPILLARY: Glucose-Capillary: 235 mg/dL — ABNORMAL HIGH (ref 70–99)

## 2021-07-27 SURGERY — COLONOSCOPY
Anesthesia: General

## 2021-07-27 MED ORDER — SODIUM CHLORIDE 0.9 % IV SOLN: INTRAVENOUS | Status: DC

## 2021-07-27 MED ORDER — PROPOFOL 500 MG/50ML IV EMUL
INTRAVENOUS | Status: AC
  Filled 2021-07-27: qty 50

## 2021-07-27 MED ORDER — LIDOCAINE HCL (CARDIAC) PF 100 MG/5ML IV SOSY
PREFILLED_SYRINGE | INTRAVENOUS | Status: DC | PRN
  Administered 2021-07-27: 50 mg via INTRAVENOUS

## 2021-07-27 MED ORDER — PROPOFOL 500 MG/50ML IV EMUL
INTRAVENOUS | Status: DC | PRN
  Administered 2021-07-27: 125 ug/kg/min via INTRAVENOUS

## 2021-07-27 MED ORDER — PROPOFOL 10 MG/ML IV BOLUS
INTRAVENOUS | Status: DC | PRN
Start: 2021-07-27 — End: 2021-07-27
  Administered 2021-07-27: 30 mg via INTRAVENOUS
  Administered 2021-07-27: 70 mg via INTRAVENOUS

## 2021-07-27 NOTE — H&P (Signed)
Outpatient short stay form Pre-procedure 07/27/2021 9:08 AM Steven Patel K. Steven Patel, M.D.  Primary Physician: Bethann Punches, M.D.  Reason for visit:  Colon cancer screening  History of present illness:  Patient presents for colonoscopy for colon cancer screening. The patient denies complaints of abdominal pain, significant change in bowel habits, or rectal bleeding.      Current Facility-Administered Medications:    0.9 %  sodium chloride infusion, , Intravenous, Continuous, [place], Boykin Nearing, MD, Last Rate: 20 mL/hr at 07/27/21 2297, Continued from Pre-op at 07/27/21 0903  Medications Prior to Admission  Medication Sig Dispense Refill Last Dose   AMBULATORY NON FORMULARY MEDICATION Trimix (30/1/10)-(Pap/Phent/PGE)  Test Dose  34ml vial   Qty #3 Refills 0  [facility] [phone] Fax [phone] 3 mL 0 07/26/2021   amLODipine (NORVASC) 5 MG tablet Take 5 mg by mouth daily.   07/26/2021   atorvastatin (LIPITOR) 10 MG tablet Take 10 mg by mouth daily.   07/26/2021   carvedilol (COREG) 6.25 MG tablet Take 1 tablet (6.25 mg total) by mouth 2 (two) times daily with a meal. 60 tablet 0 07/26/2021   esomeprazole (NEXIUM) 40 MG capsule Take 40 mg by mouth every evening.    07/26/2021   insulin detemir (LEVEMIR) 100 UNIT/ML FlexPen Inject 50 Units into the skin at bedtime.    07/26/2021   latanoprost (XALATAN) 0.005 % ophthalmic solution Place 1 drop into both eyes at bedtime.   07/26/2021   losartan (COZAAR) 50 MG tablet Take 50 mg by mouth daily.   07/26/2021   magnesium oxide (MAG-OX) 400 MG tablet Take by mouth.   07/26/2021   montelukast (SINGULAIR) 10 MG tablet Take 10 mg by mouth daily.   07/26/2021   Multiple Vitamin (MULTIVITAMIN) tablet Take 1 tablet by mouth daily.   07/26/2021   NOVOLOG FLEXPEN 100 UNIT/ML FlexPen Inject into the skin.   07/26/2021   rosuvastatin (CRESTOR) 10 MG tablet Take 10 mg by mouth every evening.    07/26/2021   sildenafil (VIAGRA) 100 MG tablet Take 100 mg by mouth daily as  needed for erectile dysfunction.   Past Week   sucralfate (CARAFATE) 1 g tablet Take 1 g by mouth 4 (four) times daily -  with meals and at bedtime.    07/26/2021   ondansetron (ZOFRAN-ODT) 4 MG disintegrating tablet SMARTSIG:1 Tablet(s) By Mouth 4-5 Times Daily (Patient not taking: Reported on 07/27/2021)   Not Taking     No Known Allergies   Past Medical History:  Diagnosis Date   Cataract cortical, senile    CRI (chronic renal insufficiency)    Diabetes mellitus without complication (HCC)    GERD (gastroesophageal reflux disease)    Glaucoma    Hepatitis    Hepatitis C   High cholesterol    Hyperlipidemia    Hypertension    Liver disease    Long-term insulin use (HCC)    MRSA infection    right hand     Review of systems:  Otherwise negative.    Physical Exam  Gen: Alert, oriented. Appears stated age.  HEENT: [place]/AT. PERRLA. Lungs: CTA, no wheezes. CV: RR nl S1, S2. Abd: soft, benign, no masses. BS+ Ext: No edema. Pulses 2+    Planned procedures: Proceed with colonoscopy. The patient understands the nature of the planned procedure, indications, risks, alternatives and potential complications including but not limited to bleeding, infection, perforation, damage to internal organs and possible oversedation/side effects from anesthesia. The patient agrees and gives consent  to proceed.  Please refer to procedure notes for findings, recommendations and patient disposition/instructions.     Miho Monda K. Steven Patel, M.D. Gastroenterology 07/27/2021  9:08 AM

## 2021-07-27 NOTE — Op Note (Signed)
[facility] Gastroenterology Patient Name: Steven Patel Procedure Date: 07/27/2021 8:56 AM MRN: [id number] Account #: [id number] Date of Birth: 07-Dec-1950 Admit Type: Outpatient Age: 71 Room: [facility] ENDO ROOM 2 Gender: Male Note Status: Finalized Instrument Name: Jasper Riling [id number] Procedure:             Colonoscopy Indications:           Screening for colorectal malignant neoplasm Providers:             Lorie Apley K. Lovette Merta MD, MD Medicines:             Propofol per Anesthesia Complications:         No immediate complications. Procedure:             Pre-Anesthesia Assessment:                        - The risks and benefits of the procedure and the                         sedation options and risks were discussed with the                         patient. All questions were answered and informed                         consent was obtained.                        - Patient identification and proposed procedure were                         verified prior to the procedure by the nurse. The                         procedure was verified in the procedure room.                        - ASA Grade Assessment: III - A patient with severe                         systemic disease.                        - After reviewing the risks and benefits, the patient                         was deemed in satisfactory condition to undergo the                         procedure.                        After obtaining informed consent, the colonoscope was                         passed under direct vision. Throughout the procedure,                         the patient's blood pressure, pulse, and oxygen  saturations were monitored continuously. The                         Colonoscope was introduced through the anus and                         advanced to the the cecum, identified by appendiceal                         orifice and ileocecal valve. The colonoscopy was                          performed without difficulty. The patient tolerated                         the procedure well. The quality of the bowel                         preparation was excellent. The ileocecal valve,                         appendiceal orifice, and rectum were photographed. Findings:      The perianal and digital rectal examinations were normal. Pertinent       negatives include normal sphincter tone and no palpable rectal lesions.      Non-bleeding internal hemorrhoids were found during retroflexion. The       hemorrhoids were Grade I (internal hemorrhoids that do not prolapse).      Many small-mouthed diverticula were found in the sigmoid colon. There       was no evidence of diverticular bleeding.      The exam was otherwise without abnormality on direct and retroflexion       views. Impression:            - Non-bleeding internal hemorrhoids.                        - Mild diverticulosis in the sigmoid colon. There was                         no evidence of diverticular bleeding.                        - The examination was otherwise normal on direct and                         retroflexion views.                        - No specimens collected. Recommendation:        - Patient has a contact number available for                         emergencies. The signs and symptoms of potential                         delayed complications were discussed with the patient.                         Return to normal activities tomorrow.  Written                         discharge instructions were provided to the patient.                        - Resume previous diet.                        - Continue present medications.                        - No repeat colonoscopy due to current age (55 years                         or older) and the absence of colonic polyps.                        - Return to GI office PRN.                        - The findings and recommendations were discussed with                          the patient.                        - You do NOT require further colon cancer screening                         measures (Annual stool testing (i.e. hemoccult, FIT,                         cologuard), sigmoidoscopy, colonoscopy or CT                         colonography). You should share this recommendation                         with your Primary Care provider.                        - The findings and recommendations were discussed with                         the patient. Procedure Code(s):     --- Professional ---                        [id number], Colorectal cancer screening; colonoscopy on                         individual not meeting criteria for high risk Diagnosis Code(s):     --- Professional ---                        K57.30, Diverticulosis of large intestine without                         perforation or abscess without bleeding  K64.0, First degree hemorrhoids                        Z12.11, Encounter for screening for malignant neoplasm                         of colon CPT copyright 2019 American Medical Association. All rights reserved. The codes documented in this report are preliminary and upon coder review may  be revised to meet current compliance requirements. Efrain Sella MD, MD 07/27/2021 9:33:14 AM This report has been signed electronically. Number of Addenda: 0 Note Initiated On: 07/27/2021 8:56 AM Scope Withdrawal Time: 0 hours 6 minutes 4 seconds  Total Procedure Duration: 0 hours 8 minutes 41 seconds  Estimated Blood Loss:  Estimated blood loss: none.      [facility]

## 2021-07-27 NOTE — Interval H&P Note (Signed)
History and Physical Interval Note:  07/27/2021 9:09 AM  Steven Patel  has presented today for surgery, with the diagnosis of Colon cancer screening (Z12.11).  The various methods of treatment have been discussed with the patient and family. After consideration of risks, benefits and other options for treatment, the patient has consented to  Procedure(s) with comments: COLONOSCOPY (N/A) - IDDM as a surgical intervention.  The patient's history has been reviewed, patient examined, no change in status, stable for surgery.  I have reviewed the patient's chart and labs.  Questions were answered to the patient's satisfaction.     [place], [place]

## 2021-07-27 NOTE — Anesthesia Postprocedure Evaluation (Signed)
Anesthesia Post Note  Patient: Steven Patel  Procedure(s) Performed: COLONOSCOPY  Patient location during evaluation: Endoscopy Anesthesia Type: General Level of consciousness: awake and alert Pain management: pain level controlled Vital Signs Assessment: post-procedure vital signs reviewed and stable Respiratory status: spontaneous breathing, nonlabored ventilation, respiratory function stable and patient connected to nasal cannula oxygen Cardiovascular status: blood pressure returned to baseline and stable Postop Assessment: no apparent nausea or vomiting Anesthetic complications: no   No notable events documented.   Last Vitals:  Vitals:   07/27/21 0948 07/27/21 0956  BP: 133/83 131/75  Pulse: 66 67  Resp: (!) 21 (!) 21  Temp:    SpO2: 98% 100%    Last Pain:  Vitals:   07/27/21 0956  TempSrc:   PainSc: 0-No pain                 Cleda Mccreedy Brent Noto

## 2021-07-27 NOTE — Anesthesia Preprocedure Evaluation (Signed)
Anesthesia Evaluation  Patient identified by MRN, date of birth, ID band Patient awake    Reviewed: Allergy & Precautions, NPO status , Patient's Chart, lab work & pertinent test results  History of Anesthesia Complications Negative for: history of anesthetic complications  Airway Mallampati: III  TM Distance: >3 FB Neck ROM: Full    Dental  (+) Poor Dentition, Chipped, Missing, Upper Dentures   Pulmonary neg sleep apnea, neg COPD, former smoker,    Pulmonary exam normal        Cardiovascular hypertension, Pt. on medications (-) CAD, (-) Past MI, (-) Cardiac Stents and (-) CABG Normal cardiovascular exam - Systolic murmurs    Neuro/Psych negative neurological ROS  negative psych ROS   GI/Hepatic GERD  ,(+) Hepatitis - (s/p treatment), C  Endo/Other  diabetes, Insulin Dependent  Renal/GU Renal InsufficiencyRenal disease     Musculoskeletal negative musculoskeletal ROS (+)   Abdominal (+) - obese,   Peds  Hematology negative hematology ROS (+)   Anesthesia Other Findings Past Medical History: No date: Cataract cortical, senile No date: CRI (chronic renal insufficiency) No date: Diabetes mellitus without complication (HCC) No date: GERD (gastroesophageal reflux disease) No date: Glaucoma No date: Hepatitis     Comment:  Hepatitis C No date: High cholesterol No date: Hyperlipidemia No date: Hypertension No date: Liver disease No date: Long-term insulin use (HCC) No date: MRSA infection     Comment:  right hand    Reproductive/Obstetrics                             Anesthesia Physical  Anesthesia Plan  ASA: 3  Anesthesia Plan: General   Post-op Pain Management:    Induction: Intravenous  PONV Risk Score and Plan: 1 and Propofol infusion  Airway Management Planned: Natural Airway  Additional Equipment:   Intra-op Plan:   Post-operative Plan:   Informed Consent: I  have reviewed the patients History and Physical, chart, labs and discussed the procedure including the risks, benefits and alternatives for the proposed anesthesia with the patient or authorized representative who has indicated his/her understanding and acceptance.     Dental advisory given  Plan Discussed with: CRNA and Anesthesiologist  Anesthesia Plan Comments: (Patient consented for risks of anesthesia including but not limited to:  - adverse reactions to medications - risk of airway placement if required - damage to eyes, teeth, lips or other oral mucosa - nerve damage due to positioning  - sore throat or hoarseness - Damage to heart, brain, nerves, lungs, other parts of body or loss of life  Patient voiced understanding.)        Anesthesia Quick Evaluation

## 2021-07-27 NOTE — Transfer of Care (Signed)
Immediate Anesthesia Transfer of Care Note  Patient: Steven Patel  Procedure(s) Performed: COLONOSCOPY  Patient Location: PACU  Anesthesia Type:MAC  Level of Consciousness: awake, alert  and oriented  Airway & Oxygen Therapy: Patient Spontanous Breathing  Post-op Assessment: Report given to RN and Post -op Vital signs reviewed and stable  Post vital signs: stable  Last Vitals:  Vitals Value Taken Time  BP    Temp    Pulse 76 07/27/21 0929  Resp 26 07/27/21 0929  SpO2 98 % 07/27/21 0929  Vitals shown include unvalidated device data.  Last Pain:  Vitals:   07/27/21 0840  TempSrc: Temporal  PainSc: 0-No pain         Complications: No notable events documented.

## 2021-07-28 ENCOUNTER — Encounter: Payer: Self-pay | Admitting: Internal Medicine

## 2022-07-21 ENCOUNTER — Ambulatory Visit: Payer: Medicare Other | Admitting: Podiatry

## 2022-07-21 DIAGNOSIS — M79675 Pain in left toe(s): Secondary | ICD-10-CM | POA: Diagnosis not present

## 2022-07-21 DIAGNOSIS — B351 Tinea unguium: Secondary | ICD-10-CM | POA: Diagnosis not present

## 2022-07-21 DIAGNOSIS — M79674 Pain in right toe(s): Secondary | ICD-10-CM

## 2022-07-21 NOTE — Progress Notes (Signed)
   Chief Complaint  Patient presents with   Foot Problem    DIABETIC AND THICK TOENAILS    SUBJECTIVE Patient with a history of diabetes mellitus presents to office today complaining of elongated, thickened nails that cause pain while ambulating in shoes.  Patient is unable to trim their own nails. Patient is here for further evaluation and treatment.  Past Medical History:  Diagnosis Date   Cataract cortical, senile    CRI (chronic renal insufficiency)    Diabetes mellitus without complication (HCC)    GERD (gastroesophageal reflux disease)    Glaucoma    Hepatitis    Hepatitis C   High cholesterol    Hyperlipidemia    Hypertension    Liver disease    Long-term insulin use (HCC)    MRSA infection    right hand     No Known Allergies   OBJECTIVE General Patient is awake, alert, and oriented x 3 and in no acute distress. Derm Skin is dry and supple bilateral. Negative open lesions or macerations. Remaining integument unremarkable. Nails are tender, long, thickened and dystrophic with subungual debris, consistent with onychomycosis, 1-5 bilateral. No signs of infection noted. Vasc  DP and PT pedal pulses palpable bilaterally. Temperature gradient within normal limits.  Neuro Epicritic and protective threshold sensation diminished bilaterally.  Musculoskeletal Exam No symptomatic pedal deformities noted bilateral. Muscular strength within normal limits.  ASSESSMENT 1. Diabetes Mellitus w/ peripheral neuropathy 2.  Pain due to onychomycosis of toenails bilateral  PLAN OF CARE 1. Patient evaluated today. 2. Instructed to maintain good pedal hygiene and foot care. Stressed importance of controlling blood sugar.  3. Mechanical debridement of nails 1-5 bilaterally performed using a nail nipper. Filed with dremel without incident.  4. Return to clinic in 3 mos.     Felecia Shelling, DPM [facility]  Dr. Felecia Shelling, DPM    [address] [place], [place] [address]                Office [phone]  Fax [phone]

## 2022-10-26 ENCOUNTER — Encounter: Payer: Self-pay | Admitting: Podiatry

## 2022-10-26 ENCOUNTER — Ambulatory Visit: Payer: Medicare Other | Admitting: Podiatry

## 2022-10-26 VITALS — BP 185/87 | HR 64

## 2022-10-26 DIAGNOSIS — E119 Type 2 diabetes mellitus without complications: Secondary | ICD-10-CM | POA: Insufficient documentation

## 2022-10-26 DIAGNOSIS — B351 Tinea unguium: Secondary | ICD-10-CM | POA: Diagnosis not present

## 2022-10-26 DIAGNOSIS — M79674 Pain in right toe(s): Secondary | ICD-10-CM | POA: Diagnosis not present

## 2022-10-26 DIAGNOSIS — M79675 Pain in left toe(s): Secondary | ICD-10-CM | POA: Diagnosis not present

## 2022-10-26 NOTE — Progress Notes (Signed)
This patient returns to my office for at risk foot care.  This patient requires this care by a professional since this patient will be at risk due to having  diabetes.  This patient is unable to cut nails himself since the patient cannot reach his nails.These nails are painful walking and wearing shoes.  This patient presents for at risk foot care today.  General Appearance  Alert, conversant and in no acute stress.  Vascular  Dorsalis pedis and posterior tibial  pulses are palpable  bilaterally.  Capillary return is within normal limits  bilaterally. Temperature is within normal limits  bilaterally.  Neurologic  Senn-Weinstein monofilament wire test within normal limits  bilaterally. Muscle power within normal limits bilaterally.  Nails Thick disfigured discolored nails with subungual debris  from hallux to fifth toes bilaterally. No evidence of bacterial infection or drainage bilaterally.  Orthopedic  No limitations of motion  feet .  No crepitus or effusions noted.  No bony pathology or digital deformities noted.  Skin  normotropic skin with no porokeratosis noted bilaterally.  No signs of infections or ulcers noted.     Onychomycosis  Pain in right toes  Pain in left toes  Consent was obtained for treatment procedures.   Mechanical debridement of nails 1-5  bilaterally performed with a nail nipper.  Filed with dremel without incident.    Return office visit   3 months                   Told patient to return for periodic foot care and evaluation due to potential at risk complications.   Tynan Boesel DPM   

## 2023-02-01 ENCOUNTER — Ambulatory Visit: Payer: Medicare Other | Admitting: Podiatry

## 2023-02-19 ENCOUNTER — Ambulatory Visit: Payer: Medicare Other | Admitting: Podiatry

## 2023-02-22 ENCOUNTER — Ambulatory Visit: Payer: Medicare Other | Admitting: Podiatry

## 2023-02-26 ENCOUNTER — Ambulatory Visit: Payer: Medicare Other | Admitting: Podiatry

## 2023-02-26 DIAGNOSIS — E119 Type 2 diabetes mellitus without complications: Secondary | ICD-10-CM

## 2023-02-26 DIAGNOSIS — M2042 Other hammer toe(s) (acquired), left foot: Secondary | ICD-10-CM | POA: Diagnosis not present

## 2023-02-26 DIAGNOSIS — B351 Tinea unguium: Secondary | ICD-10-CM

## 2023-02-26 DIAGNOSIS — M2041 Other hammer toe(s) (acquired), right foot: Secondary | ICD-10-CM | POA: Diagnosis not present

## 2023-02-26 DIAGNOSIS — M79674 Pain in right toe(s): Secondary | ICD-10-CM | POA: Diagnosis not present

## 2023-02-26 DIAGNOSIS — M79675 Pain in left toe(s): Secondary | ICD-10-CM

## 2023-03-07 ENCOUNTER — Encounter: Payer: Self-pay | Admitting: Podiatry

## 2023-03-07 NOTE — Progress Notes (Signed)
ANNUAL DIABETIC FOOT EXAM  Subjective: Steven Patel presents today annual diabetic foot exam.  Patient confirms h/o diabetes.  Patient denies any h/o foot wounds.  Patient denies any numbness, tingling, burning, or pins/needle sensation in feet.  Risk factors: diabetes, HTN, hyperlipidemia, h/o tobacco use in remission.  Danella Penton, MD is patient's PCP.  Past Medical History:  Diagnosis Date   Cataract cortical, senile    CRI (chronic renal insufficiency)    Diabetes mellitus without complication (HCC)    GERD (gastroesophageal reflux disease)    Glaucoma    Hepatitis    Hepatitis C   High cholesterol    Hyperlipidemia    Hypertension    Liver disease    Long-term insulin use (HCC)    MRSA infection    right hand    Patient Active Problem List   Diagnosis Date Noted   Diabetes mellitus without complication (HCC) 10/26/2022   Weakness 02/27/2018   HTN (hypertension) 02/27/2018   HLD (hyperlipidemia) 02/27/2018   GERD (gastroesophageal reflux disease) 02/27/2018   Diabetes (HCC) 02/27/2018   Abnormal EKG 02/27/2018   Past Surgical History:  Procedure Laterality Date   COLONOSCOPY     COLONOSCOPY N/A 07/27/2021   Procedure: COLONOSCOPY;  Surgeon: Toledo, Boykin Nearing, MD;  Location: [facility] ENDOSCOPY;  Service: Gastroenterology;  Laterality: N/A;  IDDM   ESOPHAGOGASTRODUODENOSCOPY (EGD) WITH PROPOFOL N/A 09/18/2017   Procedure: ESOPHAGOGASTRODUODENOSCOPY (EGD) WITH PROPOFOL;  Surgeon: Toledo, Boykin Nearing, MD;  Location: [facility] ENDOSCOPY;  Service: Gastroenterology;  Laterality: N/A;   HAND EXPLORATION     WISDOM TOOTH EXTRACTION     Current Outpatient Medications on File Prior to Visit  Medication Sig Dispense Refill   AMBULATORY NON FORMULARY MEDICATION Trimix (30/1/10)-(Pap/Phent/PGE)  Test Dose  1ml vial   Qty #3 Refills 0  [facility] [phone] Fax [phone] 3 mL 0   amLODipine (NORVASC) 5 MG tablet Take 5 mg by mouth daily.     atorvastatin  (LIPITOR) 10 MG tablet Take 10 mg by mouth daily.     carvedilol (COREG) 6.25 MG tablet Take 1 tablet (6.25 mg total) by mouth 2 (two) times daily with a meal. 60 tablet 0   esomeprazole (NEXIUM) 40 MG capsule Take 40 mg by mouth every evening.      insulin detemir (LEVEMIR) 100 UNIT/ML FlexPen Inject 50 Units into the skin at bedtime.      latanoprost (XALATAN) 0.005 % ophthalmic solution Place 1 drop into both eyes at bedtime.     losartan (COZAAR) 50 MG tablet Take 50 mg by mouth daily.     magnesium oxide (MAG-OX) 400 MG tablet Take by mouth.     montelukast (SINGULAIR) 10 MG tablet Take 10 mg by mouth daily.     Multiple Vitamin (MULTIVITAMIN) tablet Take 1 tablet by mouth daily.     NOVOLOG FLEXPEN 100 UNIT/ML FlexPen Inject into the skin.     ondansetron (ZOFRAN-ODT) 4 MG disintegrating tablet      rosuvastatin (CRESTOR) 10 MG tablet Take 10 mg by mouth every evening.      sildenafil (VIAGRA) 100 MG tablet Take 100 mg by mouth daily as needed for erectile dysfunction.     sucralfate (CARAFATE) 1 g tablet Take 1 g by mouth 4 (four) times daily -  with meals and at bedtime.      No current facility-administered medications on file prior to visit.    No Known Allergies Social History   Occupational History   Not  on file  Tobacco Use   Smoking status: Former    Current packs/day: 2.00    Average packs/day: 2.0 packs/day for 3.0 years (6.0 ttl pk-yrs)    Types: Cigarettes   Smokeless tobacco: Never   Tobacco comments:    last attempted to quit 08/18/1995  Vaping Use   Vaping status: Never Used  Substance and Sexual Activity   Alcohol use: Yes    Alcohol/week: 5.0 standard drinks of alcohol    Types: 1 Glasses of wine, 2 Cans of beer, 1 Shots of liquor, 1 Standard drinks or equivalent per week    Comment: per week    Drug use: No   Sexual activity: Yes    Comment: Male    Family History  Problem Relation Age of Onset   Cataracts Mother    Glaucoma Mother    Diabetes  Brother     There is no immunization history on file for this patient.   Review of Systems: Negative except as noted in the HPI.   Objective: Steven Patel is a pleasant 72 y.o. male in NAD. AAO X 3.  Vascular Examination: Capillary refill time immediate b/l. Vascular status intact b/l with palpable pedal pulses. Pedal hair present b/l. No pain with calf compression b/l. Skin temperature gradient WNL b/l. No cyanosis or clubbing b/l. No ischemia or gangrene noted b/l.   Neurological Examination: Sensation grossly intact b/l with 10 gram monofilament.  Dermatological Examination: Pedal skin with normal turgor, texture and tone b/l.  No open wounds. No interdigital macerations.   Toenails 1-5 b/l thick, discolored, elongated with subungual debris and pain on dorsal palpation.   No hyperkeratotic nor porokeratotic lesions present on today's visit.  Musculoskeletal Examination: Muscle strength 5/5 to all lower extremity muscle groups bilaterally. Hammertoe(s) noted to the bilateral 2nd toes.  Radiographs: None  ADA Risk Categorization: Low Risk :  Patient has all of the following: Intact protective sensation No prior foot ulcer  No severe deformity Pedal pulses present  Assessment: 1. Pain due to onychomycosis of toenails of both feet   2. Acquired hammertoes of both feet   3. Diabetes mellitus without complication (HCC)   4. Encounter for diabetic foot exam (HCC)     Plan: -Consent given for treatment as described below: -Examined patient. -Diabetic foot examination performed today. -Continue diabetic foot care principles: inspect feet daily, monitor glucose as recommended by PCP and/or Endocrinologist, and follow prescribed diet per PCP, Endocrinologist and/or dietician. -Toenails 1-5 b/l were debrided in length and girth with sterile nail nippers and dremel without iatrogenic bleeding.  -Patient/POA to call should there be question/concern in the interim. Return in  about 3 months (around 05/29/2023).  Freddie Breech, DPM

## 2023-05-28 ENCOUNTER — Encounter: Payer: Self-pay | Admitting: Podiatry

## 2023-05-28 ENCOUNTER — Ambulatory Visit: Payer: Medicare Other | Admitting: Podiatry

## 2023-05-28 DIAGNOSIS — M79674 Pain in right toe(s): Secondary | ICD-10-CM

## 2023-05-28 DIAGNOSIS — B351 Tinea unguium: Secondary | ICD-10-CM

## 2023-05-28 DIAGNOSIS — M79675 Pain in left toe(s): Secondary | ICD-10-CM | POA: Diagnosis not present

## 2023-05-31 NOTE — Progress Notes (Signed)
  Subjective:  Patient ID: Steven Patel, male    DOB: 01-14-1951,  MRN: [id number]  72 y.o. male presents preventative diabetic foot care and painful elongated mycotic toenails 1-5 bilaterally which are tender when wearing enclosed shoe gear. Pain is relieved with periodic professional debridement.  New problem(s): None   PCP is Danella Penton, MD , and last visit was April 09, 2023.  No Known Allergies  Review of Systems: Negative except as noted in the HPI.   Objective:  Steven Patel is a pleasant 72 y.o. male in NAD.Marland Kitchen AAO x 3.  Vascular Examination: Vascular status intact b/l with palpable pedal pulses. CFT immediate b/l. Pedal hair present. No edema. No pain with calf compression b/l. Skin temperature gradient WNL b/l. No varicosities noted. No cyanosis or clubbing noted.  Neurological Examination: Sensation grossly intact b/l with 10 gram monofilament. Vibratory sensation intact b/l.  Dermatological Examination: Pedal skin with normal turgor, texture and tone b/l. No open wounds nor interdigital macerations noted. Toenails 1-5 b/l thick, discolored, elongated with subungual debris and pain on dorsal palpation. No hyperkeratotic lesions noted b/l.   Musculoskeletal Examination: Muscle strength 5/5 to b/l LE.  No pain, crepitus noted b/l.  Hammertoes b/l 2nd digits.. Patient ambulates independently without assistive aids.   Radiographs: None  Last A1c:       No data to display         Assessment:   1. Pain due to onychomycosis of toenails of both feet    Plan:  -Patient was evaluated today. All questions/concerns addressed on today's visit. -Patient to continue soft, supportive shoe gear daily. -Mycotic toenails 1-5 bilaterally were debrided in length and girth with sterile nail nippers and dremel without incident. -Patient/POA to call should there be question/concern in the interim.  Return in about 3 months (around 08/28/2023).  Freddie Breech, DPM

## 2023-08-30 ENCOUNTER — Ambulatory Visit: Payer: Medicare Other | Admitting: Podiatry

## 2023-08-30 ENCOUNTER — Encounter: Payer: Self-pay | Admitting: Podiatry

## 2023-08-30 VITALS — Ht 72.0 in | Wt 180.0 lb

## 2023-08-30 DIAGNOSIS — E119 Type 2 diabetes mellitus without complications: Secondary | ICD-10-CM

## 2023-08-30 DIAGNOSIS — B351 Tinea unguium: Secondary | ICD-10-CM

## 2023-08-30 DIAGNOSIS — M79675 Pain in left toe(s): Secondary | ICD-10-CM

## 2023-08-30 DIAGNOSIS — M79674 Pain in right toe(s): Secondary | ICD-10-CM | POA: Diagnosis not present

## 2023-08-30 DIAGNOSIS — L84 Corns and callosities: Secondary | ICD-10-CM

## 2023-09-09 NOTE — Progress Notes (Signed)
  Subjective:  Patient ID: Steven Patel, male    DOB: 05/06/1951,  MRN: [id number]  73 y.o. male presents to clinic with  preventative diabetic foot care and callus(es) of both feet and painful thick toenails that are difficult to trim. Painful toenails interfere with ambulation. Aggravating factors include wearing enclosed shoe gear. Pain is relieved with periodic professional debridement. Painful calluses are aggravated when weightbearing with and without shoegear. Pain is relieved with periodic professional debridement.  Chief Complaint  Patient presents with   Nail Problem    Pt is here for [facility] last A1C was 7.1 PCP is Dr Cleotilde and LOV was in December.   New problem(s): None   PCP is Cleotilde Oneil FALCON, MD.  No Known Allergies  Review of Systems: Negative except as noted in the HPI.   Objective:  Steven Patel is a pleasant 73 y.o. male WD, WN in NAD. AAO x 3.  Vascular Examination: Vascular status intact b/l with palpable pedal pulses. CFT immediate b/l. No edema. No pain with calf compression b/l. Skin temperature gradient WNL b/l.   Neurological Examination: Sensation grossly intact b/l with 10 gram monofilament.   Dermatological Examination: Pedal skin with normal turgor, texture and tone b/l. Toenails 1-5 b/l thick, discolored, elongated with subungual debris and pain on dorsal palpation. Hyperkeratotic lesion(s) submet head 5 b/l.  No erythema, no edema, no drainage, no fluctuance.  Musculoskeletal Examination: Muscle strength 5/5 to b/l LE. Hammertoe(s) noted to the bilateral 2nd toes.  Radiographs: None  Last A1c:       No data to display           Assessment:   1. Pain due to onychomycosis of toenails of both feet   2. Callus   3. Diabetes mellitus without complication (HCC)    Plan:  Patient was evaluated and treated. All patient's and/or POA's questions/concerns addressed on today's visit. Toenails 1-5 debrided in length and girth without incident.  Callus(es) submet head 5 b/l pared with sharp debridement without incident. Continue daily foot inspections and monitor blood glucose per PCP/Endocrinologist's recommendations. Continue soft, supportive shoe gear daily. Report any pedal injuries to medical professional. Call office if there are any questions/concerns. -Patient/POA to call should there be question/concern in the interim.  Return in about 3 months (around 11/27/2023).  Delon LITTIE Merlin, DPM      [place] LOCATION: [address] [place] [address]                   Office [phone]   [facility] LOCATION: [address] [place], [place] [address] Office [phone]

## 2023-11-29 ENCOUNTER — Ambulatory Visit: Payer: Medicare Other | Admitting: Podiatry

## 2023-11-29 ENCOUNTER — Encounter: Payer: Self-pay | Admitting: Podiatry

## 2023-11-29 DIAGNOSIS — M79675 Pain in left toe(s): Secondary | ICD-10-CM | POA: Diagnosis not present

## 2023-11-29 DIAGNOSIS — M79674 Pain in right toe(s): Secondary | ICD-10-CM

## 2023-11-29 DIAGNOSIS — B351 Tinea unguium: Secondary | ICD-10-CM | POA: Diagnosis not present

## 2023-11-29 DIAGNOSIS — L84 Corns and callosities: Secondary | ICD-10-CM

## 2023-11-29 DIAGNOSIS — E119 Type 2 diabetes mellitus without complications: Secondary | ICD-10-CM

## 2023-12-03 NOTE — Progress Notes (Signed)
  Subjective:  Patient ID: Steven Patel, male    DOB: November 21, 1950,  MRN: [id number]  74 y.o. male presents preventative diabetic foot care and callus(es) left foot and painful mycotic toenails that are difficult to trim. Painful toenails interfere with ambulation. Aggravating factors include wearing enclosed shoe gear. Pain is relieved with periodic professional debridement. Painful calluses are aggravated when weightbearing with and without shoegear. Pain is relieved with periodic professional debridement.  Chief Complaint  Patient presents with   Diabetes    "Trim my toenails."  Dr. Lavanda Porter - 09/24/2023; A1c - 6.8    New problem(s): None   PCP is Sari Cunning, MD.  No Known Allergies  Review of Systems: Negative except as noted in the HPI.   Objective:  Steven Patel is a pleasant 73 y.o. male WD, WN in NAD. AAO x 3.  Vascular Examination: Vascular status intact b/l with palpable pedal pulses. CFT immediate b/l. Pedal hair present. No edema. No pain with calf compression b/l. Skin temperature gradient WNL b/l. No varicosities noted. No cyanosis or clubbing noted.  Neurological Examination: Sensation grossly intact b/l with 10 gram monofilament. Vibratory sensation intact b/l.  Dermatological Examination: Pedal skin with normal turgor, texture and tone b/l. Toenails 1-5 b/l thick, discolored, elongated with subungual debris and pain on dorsal palpation. Hyperkeratotic lesion(s) submet head 5 left foot. No erythema, no edema, no drainage, no fluctuance.  Musculoskeletal Examination: Muscle strength 5/5 to b/l LE. Hammertoe(s) noted to the bilateral 2nd toes.  Radiographs: None  Last A1c:       No data to display           Assessment:   1. Pain due to onychomycosis of toenails of both feet   2. Callus   3. Diabetes mellitus without complication (HCC)     Plan:  Consent given for treatment. Patient examined. All patient's and/or POA's questions/concerns  addressed on today's visit. Mycotic toenails 1-5 debrided in length and girth without incident. Callus(es) submet head 5 left foot pared with sharp debridement without incident. Continue foot and shoe inspections daily. Monitor blood glucose per PCP/Endocrinologist's recommendations.Continue soft, supportive shoe gear daily. Report any pedal injuries to medical professional. Call office if there are any quesitons/concerns. -Patient/POA to call should there be question/concern in the interim.  Return in about 3 months (around 02/29/2024).  Luella Sager, DPM      [place] LOCATION: [address] [place] [address]                   Office [phone]   [facility] LOCATION: [address] [place], [place] [address] Office [phone]

## 2024-03-03 ENCOUNTER — Ambulatory Visit: Admitting: Podiatry

## 2024-03-03 ENCOUNTER — Encounter: Payer: Self-pay | Admitting: Podiatry

## 2024-03-03 DIAGNOSIS — M79675 Pain in left toe(s): Secondary | ICD-10-CM

## 2024-03-03 DIAGNOSIS — B351 Tinea unguium: Secondary | ICD-10-CM

## 2024-03-03 DIAGNOSIS — E119 Type 2 diabetes mellitus without complications: Secondary | ICD-10-CM | POA: Diagnosis not present

## 2024-03-03 DIAGNOSIS — M79674 Pain in right toe(s): Secondary | ICD-10-CM | POA: Diagnosis not present

## 2024-03-09 NOTE — Progress Notes (Signed)
  Subjective:  Patient ID: Steven Patel, male    DOB: 06-05-1951,  MRN: [id number]  Steven Patel presents to clinic today for preventative diabetic foot care and callus(es) left lower extremity and painful mycotic toenails that are difficult to trim. Painful toenails interfere with ambulation. Aggravating factors include wearing enclosed shoe gear. Pain is relieved with periodic professional debridement. Painful calluses are aggravated when weightbearing with and without shoegear. Pain is relieved with periodic professional debridement.  Chief Complaint  Patient presents with   Dfc    Rm1 Diabetic foot care/ Dr. Oneil Pinal last visit December 2025/ A1c 7.2/ Blood sugar 147   New problem(s): None.   PCP is Pinal Oneil FALCON, MD.  No Known Allergies  Review of Systems: Negative except as noted in the HPI.  Objective: No changes noted in today's physical examination. There were no vitals filed for this visit. Steven Patel is a pleasant 73 y.o. male WD, WN in NAD. AAO x 3.  Vascular Examination: Capillary refill time immediate b/l. Vascular status intact b/l with palpable pedal pulses. Pedal hair present b/l. No pain with calf compression b/l. Skin temperature gradient WNL b/l. No cyanosis or clubbing b/l. No ischemia or gangrene noted b/l.   Neurological Examination: Sensation grossly intact b/l with 10 gram monofilament. Vibratory sensation intact b/l.   Dermatological Examination: Pedal skin with normal turgor, texture and tone b/l.  No open wounds. No interdigital macerations.   Toenails 1-5 b/l thick, discolored, elongated with subungual debris and pain on dorsal palpation.   No hyperkeratotic nor porokeratotic lesions.  Musculoskeletal Examination: Muscle strength 5/5 to all lower extremity muscle groups bilaterally. Hammertoe(s) bilateral 2nd toes.. No pain, crepitus or joint limitation noted with ROM b/l LE.  Patient ambulates independently without assistive  aids.  Radiographs: None  Assessment/Plan: 1. Pain due to onychomycosis of toenails of both feet   2. Diabetes mellitus without complication [facility])    Patient was evaluated and treated. All patient's and/or POA's questions/concerns addressed on today's visit. Toenails 1-5 debrided in length and girth without incident. Continue foot and shoe inspections daily. Monitor blood glucose per PCP/Endocrinologist's recommendations. Continue soft, supportive shoe gear daily. Report any pedal injuries to medical professional. Call office if there are any questions/concerns. -Patient/POA to call should there be question/concern in the interim.   Return in about 3 months (around 06/03/2024).  Steven Patel Merlin, DPM      [place] LOCATION: [address] [place] [address]                   Office [phone]   [facility] LOCATION: [address] [place], [place] [address] Office [phone]

## 2024-06-05 ENCOUNTER — Encounter: Payer: Self-pay | Admitting: Podiatry

## 2024-06-05 ENCOUNTER — Ambulatory Visit: Admitting: Podiatry

## 2024-06-05 DIAGNOSIS — M79675 Pain in left toe(s): Secondary | ICD-10-CM | POA: Diagnosis not present

## 2024-06-05 DIAGNOSIS — M79674 Pain in right toe(s): Secondary | ICD-10-CM | POA: Diagnosis not present

## 2024-06-05 DIAGNOSIS — M2041 Other hammer toe(s) (acquired), right foot: Secondary | ICD-10-CM

## 2024-06-05 DIAGNOSIS — M2042 Other hammer toe(s) (acquired), left foot: Secondary | ICD-10-CM | POA: Diagnosis not present

## 2024-06-05 DIAGNOSIS — E119 Type 2 diabetes mellitus without complications: Secondary | ICD-10-CM | POA: Diagnosis not present

## 2024-06-05 DIAGNOSIS — B351 Tinea unguium: Secondary | ICD-10-CM | POA: Diagnosis not present

## 2024-06-05 DIAGNOSIS — Z0189 Encounter for other specified special examinations: Secondary | ICD-10-CM

## 2024-06-15 NOTE — Progress Notes (Signed)
  Subjective:  Patient ID: Steven Patel, male    DOB: 03-16-1951,  MRN: [id number]  AUDEN TATAR presents to clinic today for for annual diabetic foot examination and callus(es) left foot and painful mycotic toenails that are difficult to trim. Painful toenails interfere with ambulation. Aggravating factors include wearing enclosed shoe gear. Pain is relieved with periodic professional debridement. Painful calluses are aggravated when weightbearing with and without shoegear. Pain is relieved with periodic professional debridement. Patient states his left 5th digit toenail came off about two weeks ago. Chief Complaint  Patient presents with   [facility]    Rm4 Diabetic foot care/ A1C 7.2/ bloodsugar 126/ PCP Dr. Oneil Pinal   PCP is Pinal Oneil FALCON, MD.  No Known Allergies  Review of Systems: Negative except as noted in the HPI.  Objective: No changes noted in today's physical examination. There were no vitals filed for this visit. Steven Patel is a pleasant 73 y.o. male WD, WN in NAD. AAO x 3.   Diabetic foot exam was performed with the following findings:   Vascular Examination: Capillary refill time immediate b/l. Palpable pedal pulses. Pedal hair present b/l. Pedal edema absent. No pain with calf compression b/l. Skin temperature gradient WNL b/l. No cyanosis or clubbing b/l. No ischemia or gangrene noted b/l.   Neurological Examination: Sensation grossly intact b/l with 10 gram monofilament. Vibratory sensation intact b/l.   Dermatological Examination: Pedal skin with normal turgor, texture and tone b/l.  No open wounds. No interdigital macerations.   Anonychia noted L 5th toe. Nailbed(s) epithelialized.   Toenails right 5th toe and 1-4 b/l thick, discolored, elongated with subungual debris and pain on dorsal palpation.   No corns, calluses, nor porokeratotic lesions.  Musculoskeletal Examination: Muscle strength 5/5 to all lower extremity muscle groups bilaterally.  Hammertoe(s) left second digit and right second digit.SABRA No pain, crepitus or joint limitation noted with ROM b/l LE.  Patient ambulates independently without assistive aids.  Radiographs: None     Assessment/Plan: 1. Pain due to onychomycosis of toenails of both feet   2. Acquired hammertoes of both feet   3. Diabetes mellitus without complication (HCC)   4. Encounter for diabetic foot exam [facility])   Consent given for treatment. Diabetic foot examination performed today. All patient's and/or POA's questions/concerns addressed on today's visit. Toenails 1-4 bilaterally and R 5th toe debrided in length and girth without incident. Continue foot and shoe inspections daily. Monitor blood glucose per PCP/Endocrinologist's recommendations. Continue soft, supportive shoe gear daily. Report any pedal injuries to medical professional. Call office if there are any questions/concerns. -Patient/POA to call should there be question/concern in the interim.   Return in about 3 months (around 09/05/2024).  Delon LITTIE Merlin, DPM      [place] LOCATION: [address] [place] [address]                   Office [phone]   [facility] LOCATION: [address] [place], [place] [address] Office [phone]

## 2024-07-23 DIAGNOSIS — N289 Disorder of kidney and ureter, unspecified: Secondary | ICD-10-CM

## 2024-07-23 DIAGNOSIS — N320 Bladder-neck obstruction: Secondary | ICD-10-CM

## 2024-07-23 DIAGNOSIS — Z0000 Encounter for general adult medical examination without abnormal findings: Secondary | ICD-10-CM

## 2024-07-31 ENCOUNTER — Ambulatory Visit
Admission: RE | Admit: 2024-07-31 | Discharge: 2024-07-31 | Disposition: A | Discharge status: Home / Self Care | Source: Ambulatory Visit | Attending: Internal Medicine | Admitting: Internal Medicine

## 2024-07-31 DIAGNOSIS — N320 Bladder-neck obstruction: Secondary | ICD-10-CM | POA: Diagnosis present

## 2024-07-31 DIAGNOSIS — Z0000 Encounter for general adult medical examination without abnormal findings: Secondary | ICD-10-CM | POA: Diagnosis present

## 2024-07-31 DIAGNOSIS — N289 Disorder of kidney and ureter, unspecified: Secondary | ICD-10-CM | POA: Insufficient documentation

## 2024-09-15 ENCOUNTER — Ambulatory Visit: Admitting: Podiatry
